# Patient Record
Sex: Female | Born: 1945 | Hispanic: Yes | Marital: Married
Health system: Midwestern US, Academic
[De-identification: ages and names within clinical notes are randomized; demographics above are authoritative.]

---

## 2020-03-07 ENCOUNTER — Encounter: Admit: 2020-03-07 | Discharge: 2020-03-07 | Payer: MEDICARE

## 2020-03-07 NOTE — Telephone Encounter
Called and spoke w/pt re:her appt 5/25 w/Dr. Janace Litten. Asked her if she's had any head imaging (MRI-head, and/or CT-head) in the past 5 years ? She states no.

## 2020-03-14 ENCOUNTER — Ambulatory Visit: Admit: 2020-03-14 | Discharge: 2020-03-14 | Payer: 59

## 2020-03-14 ENCOUNTER — Encounter: Admit: 2020-03-14 | Discharge: 2020-03-14 | Payer: 59

## 2020-03-14 DIAGNOSIS — G3184 Mild cognitive impairment, so stated: Secondary | ICD-10-CM

## 2020-03-14 DIAGNOSIS — I10 Essential (primary) hypertension: Secondary | ICD-10-CM

## 2020-03-14 DIAGNOSIS — R4189 Other symptoms and signs involving cognitive functions and awareness: Secondary | ICD-10-CM

## 2020-03-14 DIAGNOSIS — M1990 Unspecified osteoarthritis, unspecified site: Secondary | ICD-10-CM

## 2020-03-14 DIAGNOSIS — K589 Irritable bowel syndrome without diarrhea: Secondary | ICD-10-CM

## 2020-03-14 DIAGNOSIS — H269 Unspecified cataract: Secondary | ICD-10-CM

## 2020-03-14 DIAGNOSIS — R413 Other amnesia: Secondary | ICD-10-CM

## 2020-03-14 MED ORDER — SERTRALINE 25 MG PO TAB
25 mg | ORAL_TABLET | Freq: Every evening | ORAL | 5 refills | Status: AC
Start: 2020-03-14 — End: ?

## 2020-03-14 NOTE — Progress Notes
Referring Provider:    Scot Jun, MD  [address]  [place],  [place] [address]      Chief Complaint:  Cynthia Bell is a 74 y.o. female here for cognitive evaluation.  Additional history provided by: husband, Manual  Onset: about 6 months  Course: slow decline    _______________________________________________________________________  _____________________HISTORY__________________________________________  Cognitive History:  Has trouble remembering pertinent details of recent events; pt wonders if stress with her daughter is playing a role.  Is not repeating herself.  Has some trouble remembering important information of the distant past.    Functional History: Lives with her husband.  Independent with basic ADLs, without prompting.  Takes medications independently without frequently forgetting.  Cooks, cleans, does laundry, all mostly as well as before, although husband reports takes longer time to cook something.  Helps handle the finances, but husband reports is slower than before and has double paid some bills.  Stopped driving d/t R wrist fx, reportedly not d/t cognitive impairment.      Behavioral and Neuropsychiatric History: PHQ-2 Score: 2 (03/14/2020 10:36 AM)  Depressed mood, currently not completely controlled off medication per pt.  Denies SI.  No noticed personality or behavioral changes.    Other ROS  Hallucinations: denies    Delusions: no  Rest tremor: no    Gait Changes: no  Chronic hearing loss: no  Speech changes: no  Cognitive fluctuations: good and bad days  Incontinence: no  Sleep:  rarely snores, doesn't act out dreams.  Denies insomnia.  Wt loss: slowly   Seizures: no  All other ROS are negative    Caregiver Burden Expressed: does not rely on caregiving    Social Support:  pt has good support     Advanced Care Planning:  Discussed with pt.  Husband is POA and advance directive is in place.    Safety Assessment (falls, etc):  Rare falls    Social hx:  Denies heavy alcohol, tobacco and illicit substance use.    Family hx:  Father with memory trouble, onset after age 1.  ______________________________________________________________________  ____________________MEDICATIONS_____________________________________  ? alendronate (FOSAMAX) 70 mg tablet Take 70 mg by mouth every 7 days.   ? cholecalciferol (VITAMIN D-3) 1,000 units tablet Take 1,000 Units by mouth daily.   ? FOLIC ACID PO Take  by mouth.   ? gabapentin (NEURONTIN) 300 mg capsule Take 300 mg by mouth three times daily. 3 @ HS   ? lisinopril (PRINIVIL; ZESTRIL) 10 mg tablet Take 10 mg by mouth daily.   ? Methotrexate Sodium 2.5 mg DsPk Take 6 Tabs by mouth every 7 days.   ? sertraline (ZOLOFT) 25 mg tablet Take one tablet by mouth at bedtime daily.       High Risk Medication Review: Gabapentin    ______________________________________________________________________________  ___________________PHYSICAL AND COGNITIVE EXAM______________________  Vital Signs: BP (!) 145/56  - Pulse 79  - Ht 160 cm (63)  - Wt 59.7 kg (131 lb 9.6 oz)  - LMP  (LMP Unknown)  - BMI 23.31 kg/m?   Asymptomatic    Mental Status:    Alert.  NAD  Speech is fluent with intact comprehension      Cranial Nerves  EOMI bilat  No facial asymmetry  Shoulders equal   Tongue protrudes midline    Motor  Moves all extremities equally  No bradykinesia or rest tremor appreciated    Coordination  No ataxia on finger to nose testing    CV  No  LE edema    Gait  Nl posture, stride, arm swing and turns     NEUROBEHAVIORAL TESTING                               Labs / Imaging  No results found for: TSH  No results found for: VITB12    B12 level 575, TSH wnl, creat wnl (05/2019)    __________________________________________________________________  _______________DIAGNOSIS AND PLAN_______________________________    DIAGNOSIS:  74 y.o. female with h/o HTN, rheumatoid arthritis who presents with a gradual decline in cognition over at least the past 6 months. Functioning is not currently significantly affected.      Problem   Cognitive Impairment    Etiology likely multifactorial, related to mood diosrder, medication.  Cannot r/o underlying neurodegenerative dz such as AD.    02/2020:  Neuropsych eval ordered       Stage:   and CDR =   0.5      CARE PLAN  Further Evaluation  ? Neuropsych eval  ? MRI head    Cognitive therapy plan  ? None currently indicated       Neuropsychiatric therapy  ? Depressed mood, currently not completely controlled off medication per pt.  Risks and benefits of starting Zoloft 25mg  qHS discussed, including but not limited to may increase risk of bleed when used in combination with Methotrexate* and pt would like to.  Rx provided.  Educated that mood disorders, if uncontrolled, can worsen cognition.    ? Risks and benefits of Gabapentin discussed, including potential to worsen cognition.  Advised to weigh risks vs benefits carefully and that if desiring to stop, that it is important to taper to discontinuation and not to stop abruptly.    Lifestyle Recommendations  ? Encouraged to stay active mentally and physically  ? Encouraged socializing and eating a heart healthy diet  ? Discussed importance of 8 hours of sleep nightly    Referrals  ? Educational materials provided   ? Caution with driving; numbers provided for formal driving evaluation, if desired  ? Research: may discuss pending further eval  ? My Alliance: [email or web address]    RTC 51m    I spent a total of 70 minutes with the patient;  35 minutes were spent in counseling and coordination of care as noted above.     Medical decision making: moderate to severe

## 2020-04-19 ENCOUNTER — Encounter: Admit: 2020-04-19 | Discharge: 2020-04-19 | Payer: MEDICARE

## 2020-04-19 ENCOUNTER — Ambulatory Visit: Admit: 2020-04-19 | Discharge: 2020-04-19 | Payer: MEDICARE

## 2020-04-19 DIAGNOSIS — G3184 Mild cognitive impairment, so stated: Secondary | ICD-10-CM

## 2020-04-19 MED ORDER — GADOBENATE DIMEGLUMINE 529 MG/ML (0.1MMOL/0.2ML) IV SOLN
10 mL | Freq: Once | INTRAVENOUS | 0 refills | Status: CP
Start: 2020-04-19 — End: ?
  Administered 2020-04-19: 17:00:00 10 mL via INTRAVENOUS

## 2020-04-25 ENCOUNTER — Encounter: Admit: 2020-04-25 | Discharge: 2020-04-25 | Payer: MEDICARE

## 2020-04-25 NOTE — Telephone Encounter
-----   Message from Noah Charon, DO sent at 04/19/2020  4:37 PM CDT -----  MRI personally reviewed.  Please let patient and family know that MRI head shows mild generalized shrinking, which can be seen with aging.  No significant stroke was seen.  There were a few incidental small lesions seen in the front part of the skull, cause of which is uncertain, but will need f/u with PCP.  Amy, if you could please also notify PCP and fax report so it's not missed.  If new, may need eval with oncology.  Thank you.

## 2020-04-25 NOTE — Telephone Encounter
Patient advised and verbalized understanding of Dr. Emilie Rutter interpretation and recommendation about MRI as stated below.  Patient states she will follow up with Dr. Thedore Mins and had no further questions at this time.      MRI head report from 04/19/2020 faxed to Dr. Doristine Church office.   Called Dr. Doristine Church office at [phone] and left message for her nurse, advising abnormal MRI report has been faxed to Dr. Thedore Mins for follow up.

## 2020-06-29 ENCOUNTER — Encounter: Admit: 2020-06-29 | Discharge: 2020-06-29 | Payer: MEDICARE

## 2020-06-29 ENCOUNTER — Ambulatory Visit: Admit: 2020-06-29 | Discharge: 2020-06-29 | Payer: MEDICARE

## 2020-06-29 DIAGNOSIS — M1990 Unspecified osteoarthritis, unspecified site: Secondary | ICD-10-CM

## 2020-06-29 DIAGNOSIS — G3184 Mild cognitive impairment, so stated: Secondary | ICD-10-CM

## 2020-06-29 DIAGNOSIS — R4189 Other symptoms and signs involving cognitive functions and awareness: Secondary | ICD-10-CM

## 2020-06-29 DIAGNOSIS — K589 Irritable bowel syndrome without diarrhea: Secondary | ICD-10-CM

## 2020-06-29 DIAGNOSIS — R413 Other amnesia: Secondary | ICD-10-CM

## 2020-06-29 DIAGNOSIS — H269 Unspecified cataract: Secondary | ICD-10-CM

## 2020-06-29 DIAGNOSIS — I10 Essential (primary) hypertension: Secondary | ICD-10-CM

## 2020-06-29 NOTE — Progress Notes
Referring Provider:    Scot Jun, MD  [address]  [place],  [place] [address]      Chief Complaint:  Cynthia Bell is a 74 y.o. female here for cognitive evaluation.  Additional history provided by: husband, Manual  Onset: 6-12 months  Course: slow decline    _______________________________________________________________________  _____________________HISTORY__________________________________________  Interval Cognitive/Functional History:  Since she was last here, 02/2020, her cognition is a little worse.  Misplacing items more.  Once in a while will forget names of immediate family.  Remains independent with basic ADLs, without prompting.  Remains slow with making meals.  Husband does the laundry mainly, but she still does it too.  Still tries to help handle the finances, but misplaces the checks or checkbook.  Takes medications independently, but does forget (unclear how often).  Does not drive.      HPI, 02/2020  Has trouble remembering pertinent details of recent events; pt wonders if stress with her daughter is playing a role.  Is not repeating herself.  Has some trouble remembering important information of the distant past.    Lives with her husband.  Independent with basic ADLs, without prompting.  Takes medications independently without frequently forgetting.  Cooks, cleans, does laundry, all mostly as well as before, although husband reports takes longer time to cook something.  Helps handle the finances, but husband reports is slower than before and has double paid some bills.  Stopped driving d/t R wrist fx, reportedly not d/t cognitive impairment.      Behavioral and Neuropsychiatric History: PHQ-2 Score: 2 (06/29/2020 11:36 AM)  Depressed mood, currently not completely controlled; has been prescribed Zoloft, unclear if taking.  No SI.  No noticed personality or behavioral changes.    Other ROS  Hallucinations: denies    Delusions: no  Rest tremor: no    Gait Changes: no  Chronic hearing loss: no  Speech changes: no  Cognitive fluctuations: good and bad days  Incontinence: no  Sleep:  rarely snores, doesn't act out dreams.  Denies insomnia.   Seizures: no  All other ROS are negative    Caregiver Burden Expressed: no   Social Support:  pt has good support     Advanced Care Planning:   Husband is POA and advance directive is in place.    Safety Assessment (falls, etc):  Rare falls    Social hx:  Denies heavy alcohol, tobacco and illicit substance use.    Family hx:  Father with memory trouble, onset after age 5.  ______________________________________________________________________  ____________________MEDICATIONS_____________________________________  ? alendronate (FOSAMAX) 70 mg tablet Take 70 mg by mouth every 7 days.   ? cholecalciferol (VITAMIN D-3) 1,000 units tablet Take 1,000 Units by mouth daily.   ? FOLIC ACID PO Take  by mouth.   ? gabapentin (NEURONTIN) 300 mg capsule Take 300 mg by mouth three times daily. 3 @ HS   ? lisinopril (PRINIVIL; ZESTRIL) 10 mg tablet Take 10 mg by mouth daily.   ? Methotrexate Sodium 2.5 mg DsPk Take 6 Tabs by mouth every 7 days.   ? sertraline (ZOLOFT) 25 mg tablet Take one tablet by mouth at bedtime daily.   (pt is not sure if medication list is correct)    High Risk Medication Review: Gabapentin    ______________________________________________________________________________  ___________________PHYSICAL AND COGNITIVE EXAM______________________  Vital Signs: BP (!) 151/55  - Pulse 68  - Ht 157.5 cm (62)  - Wt 60.8 kg (134 lb)  - LMP  (  LMP Unknown)  - BMI 24.51 kg/m?   Asymptomatic    Mental Status:    Alert.  NAD  Speech is fluent with intact comprehension      Cranial Nerves  EOMI bilat  No facial asymmetry  Shoulders equal     Motor  Moves all extremities equally  No bradykinesia or rest tremor appreciated    CV  No LE edema    Gait  Nl posture, stride, arm swing and turns     NEUROBEHAVIORAL TESTING                               Labs / Imaging  No results found for: TSH  No results found for: VITB12    B12 level 575, TSH wnl, creat wnl (05/2019)    MRI head w/wo (03/2020)  1. ?Age-appropriate mild cerebral volume loss without pattern specific   atrophy.   2. ?Mild nonspecific white matter disease, likely due to chronic   microvessel ischemic changes.   3. ?Small ill-defined enhancing T2 and DWI hyperintense bifrontal   calvarial lesions, larger on the left and indeterminate between a benign   etiology (such as asymmetric marrow or small vascular lesions) or   malignant etiology such as calvarial metastases or myelomatous lesions.     __________________________________________________________________  _______________DIAGNOSIS AND PLAN_______________________________    DIAGNOSIS:  74 y.o. female with h/o HTN, rheumatoid arthritis who presents with a gradual decline in cognition over at least the past 6-12 months. Functioning is reportedly not currently significantly affected.      Problem   Mild Neurocognitive Disorder    Vs early dementia (more consistent with objective testing).  Etiology likely multifactorial, related to mood diosrder, medication.  Cannot r/o underlying neurodegenerative dz such as AD.    02/2020:  Neuropsych eval ordered    06/2020:  STMS 21/38       Stage:   and CDR = 0.5-1      CARE PLAN  Further Evaluation  ? Neuropsych eval previously ordered - encouraged scheduling    Cognitive therapy plan  ? Risks and benefits of Aricept discussed - will hold pending further eval     Neuropsychiatric therapy  ? Depressed mood, currently not completely controlled; has been prescribed Zoloft, unclear if taking.  Advised to check and let us know.  Educated that mood disorders, if uncontrolled, can worsen cognition.    ? Risks and benefits of Gabapentin discussed, including potential to worsen cognition.  Advised to weigh risks vs benefits carefully and that if desiring to stop, that it is important to taper to discontinuation and not to stop abruptly.    Lifestyle Recommendations  ? Encouraged to stay active mentally and physically  ? Encouraged socializing and eating a heart healthy diet  ? Discussed importance of 8 hours of sleep nightly  ? Advised someone help with her medications     Referrals  ? Educational materials provided   ? Cautioned with driving; numbers provided for formal driving evaluation, if desired  ? Research: may discuss pending further eval  ? Rediscussed importance of MRI f/u with PCP regarding calvarial lesions.  If not already known, discussed importance of referral to oncology for further evaluation.  Previously requested report be faxed and PCP be notified and this has been re-requested to nursing staff as family is not sure or not if she is aware (note also sent electronically)  ? My Alliance: [email or web address]  RTC 2m, or sooner if needed    All questions answered.  Advised to call if any new symptoms, concerns or questions.  Pt and family voiced understanding.        I spent a total of 40 minutes with the patient; 25 minutes were spent in counseling and coordination of care as noted above.     Medical decision making: moderate to severe

## 2020-06-30 ENCOUNTER — Encounter: Admit: 2020-06-30 | Discharge: 2020-06-30 | Payer: MEDICARE

## 2020-06-30 NOTE — Telephone Encounter
-----   Message from Noah Charon, DO sent at 06/29/2020  2:34 PM CDT -----  Regarding: PCP call  I had previously requested MRI head w/wo (03/2020) report and images if able be sent to PCP to make sure #3 below was not missed.  I have rediscussed with the pt and husband and they are going to see if can get sooner appt with PCP (scheduled for later this month as of now) to discuss further.  If the calvarial lesions are new, should be referred to oncology for further evaluation. Thank you!       1. ?Age-appropriate mild cerebral volume loss without pattern specific   atrophy.   2. ?Mild nonspecific white matter disease, likely due to chronic   microvessel ischemic changes.   3. ?Small ill-defined enhancing T2 and DWI hyperintense bifrontal   calvarial lesions, larger on the left and indeterminate between a benign   etiology (such as asymmetric marrow or small vascular lesions) or   malignant etiology such as calvarial metastases or myelomatous lesions.

## 2020-06-30 NOTE — Telephone Encounter
LVM for triage nurse with direct line to call back if Dr. Thedore Mins has not received the radiologist report faxed to her on April 25, 2020, or needs access to images on the Cloud that [employer] share.  I said MRI is abnormal and patient needs a sooner appointment and if calvarial lesion is new may need referral to oncology, call with questions or if Dr. Thedore Mins cannot see MRI image and report.

## 2021-05-01 IMAGING — MG MAMMO SCRN BIL W/CAD TOMO
8 series · 8 of 24 positions shown · non-contrast
Comparison: The present examination has been compared to prior imaging studies.

Images Obtained from [facility]
INDICATION: Screening.
TECHNIQUE: Bilateral 2-D digital screening mammogram was performed followed by 3-D tomosynthesis.  Current study was also evaluated with a computer aided detection (CAD) system.
MAMMOGRAM FINDINGS:
There are scattered areas of fibroglandular density.
There are stable benign-type calcifications in the left breast.
No suspicious abnormality is seen in either breast.

[L MLO]
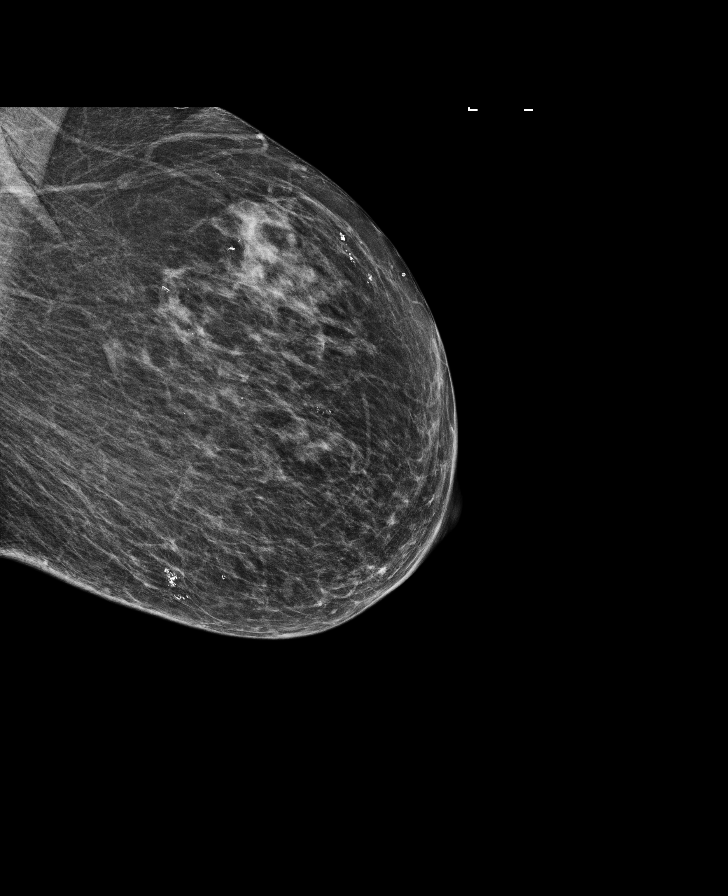

[R CC]
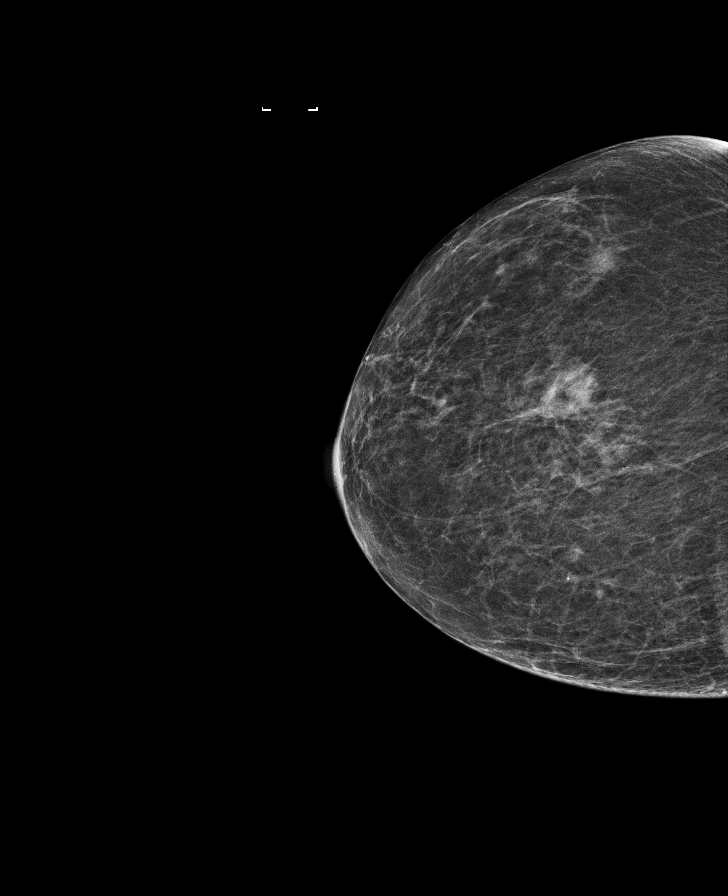

[R MLO]
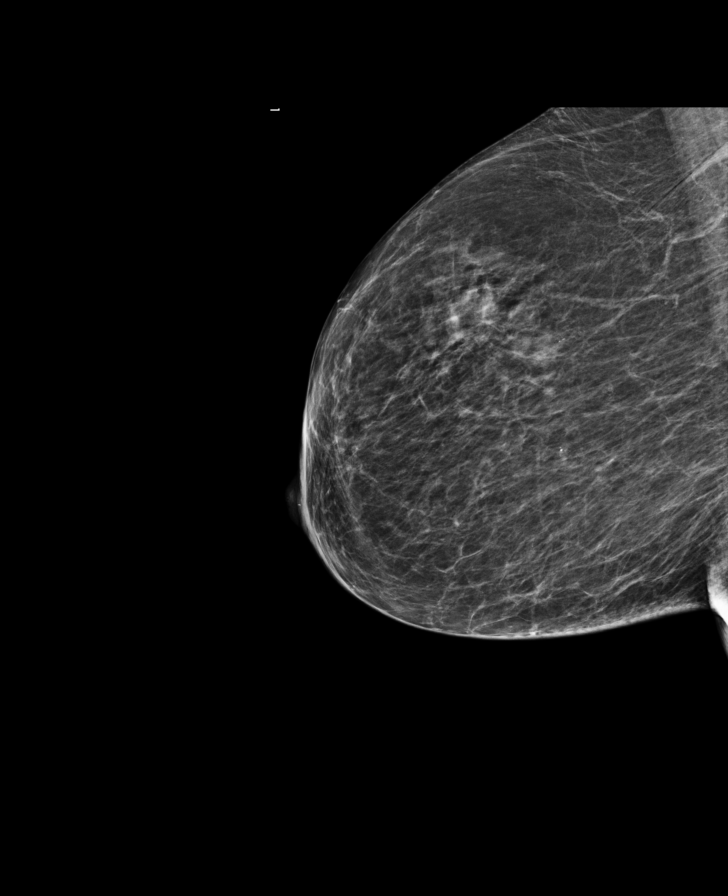

[L CC]
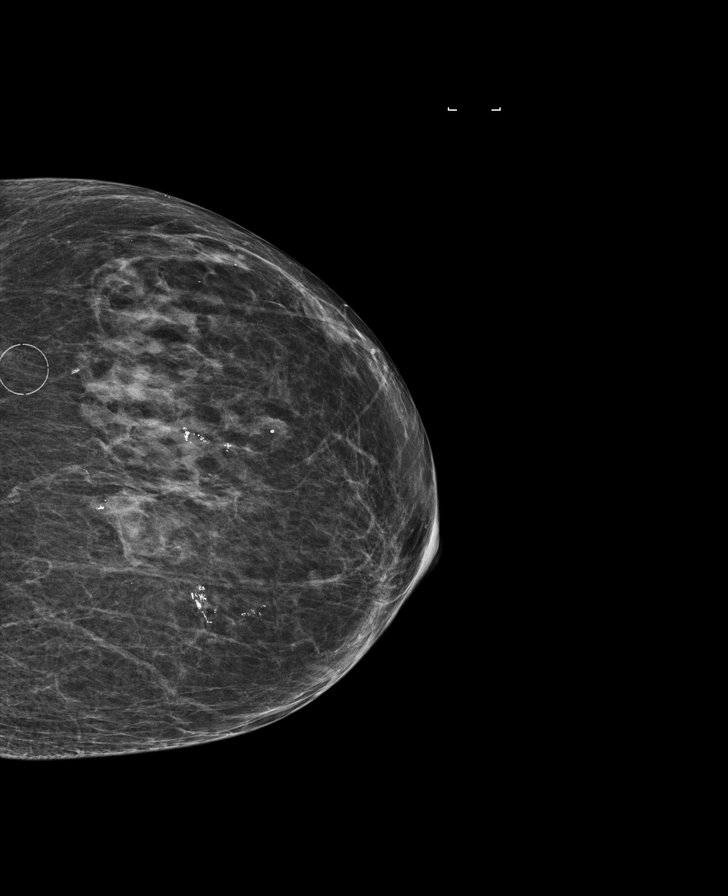

[L MLO tomo · tomo slice 22/43.0]
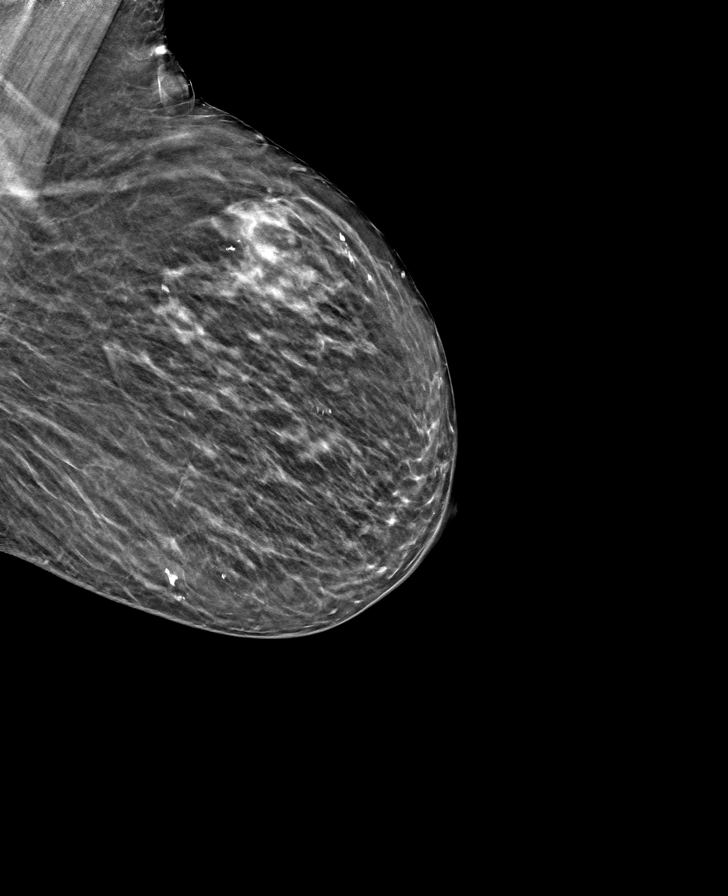

[R CC tomo · tomo slice 20/39.0]
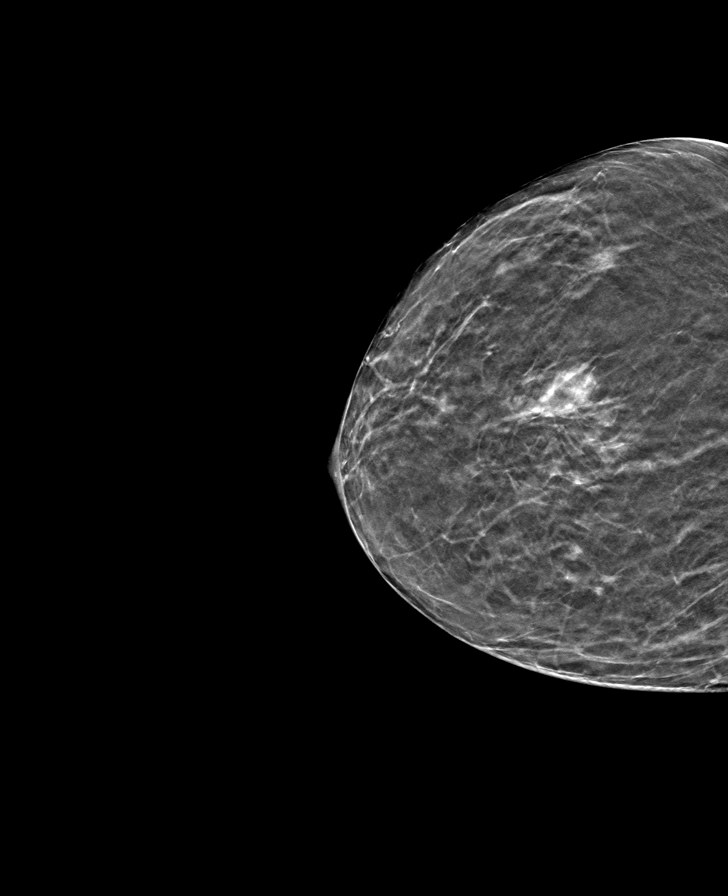

[L CC tomo · tomo slice 19/38.0]
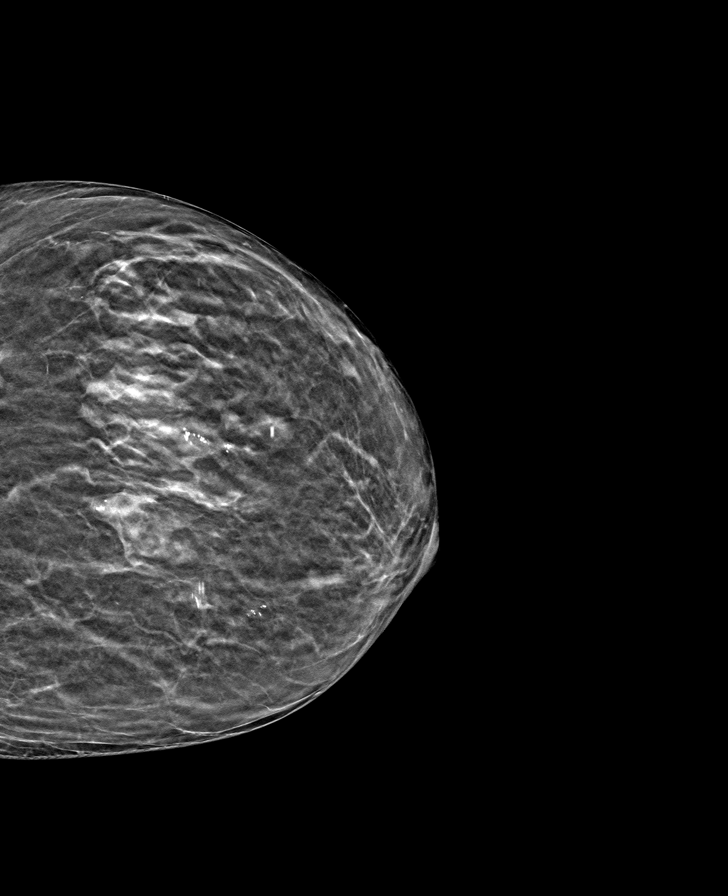

[R MLO tomo · tomo slice 21/42.0]
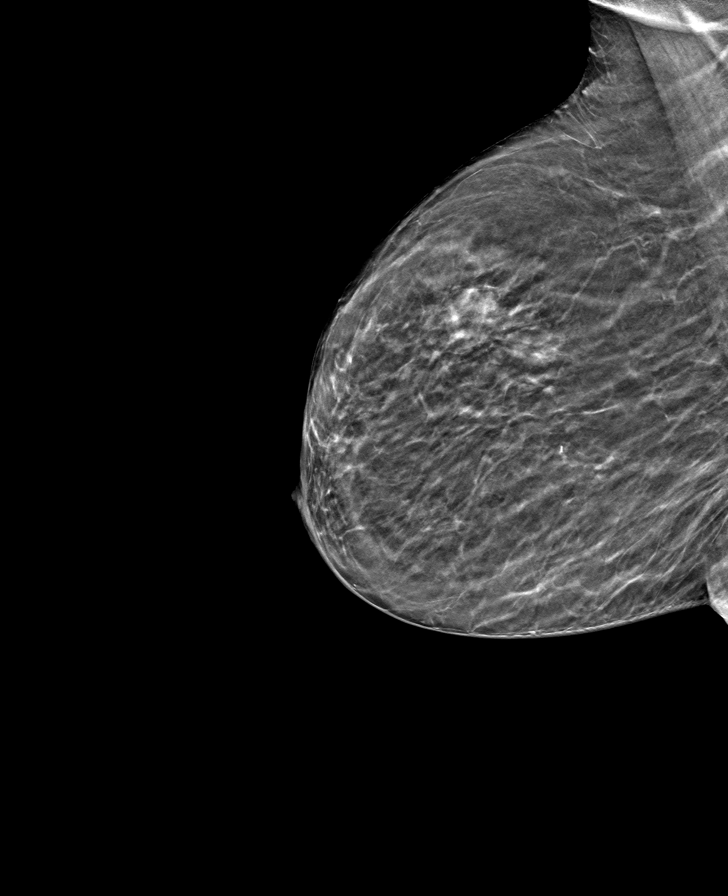

[8 of 24 positions shown; findings below may reference images not displayed]

IMPRESSION: There is no mammographic evidence of malignancy.
Screening mammogram recommended in 1 year.
BI-RADS Category 2: Benign

## 2021-05-01 IMAGING — MR MRI BRAIN W/WO CONTRAST
11 of 12 series · 47 of 48 positions shown · IV contrast (prohance)
Comparison: None.

HISTORY: 75-years-old female with abnormal findings, site specified.
TECHNIQUE: Multiplanar, multisequence MRI images of the brain are obtained prior to and following intravenous contrast.

CONTRAST: 15 mL of ProHance

[Series 5: t1_mpr_axial_pre · axial · 1.0mm · 0.90mm/px · z∈[-59,+97]mm · 7 of 160 slices shown]
[im 1/160]
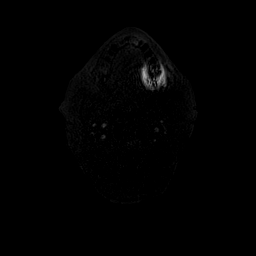
[im 27/160]
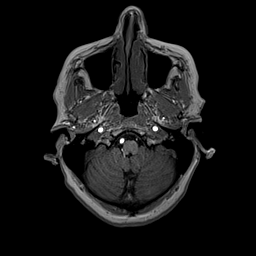
[im 54/160]
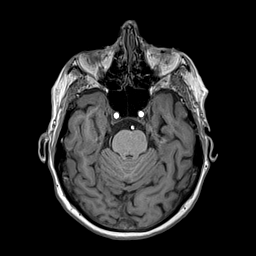
[im 80/160]
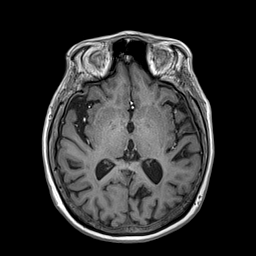
[im 107/160]
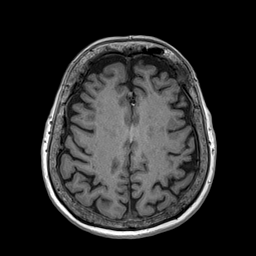
[im 133/160]
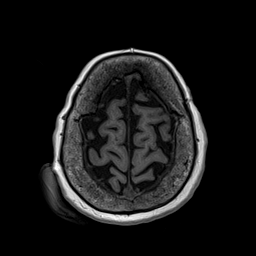
[im 160/160]
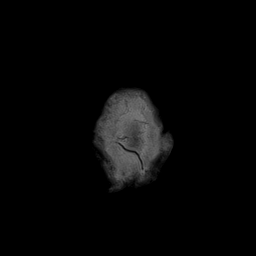

[Series 6: t1_mpr_axial_pre_mpr_sag · sagittal · 1.0mm · 0.90mm/px · 6 of 150 slices shown]
[im 1/150]
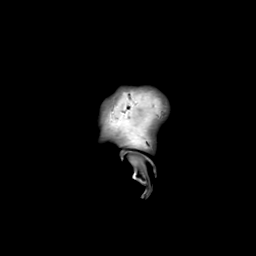
[im 30/150]
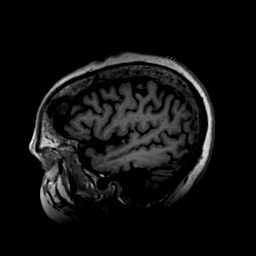
[im 60/150]
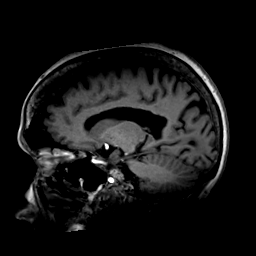
[im 90/150]
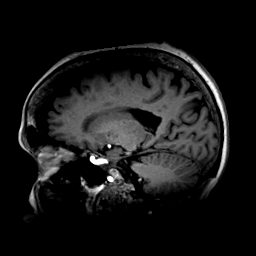
[im 120/150]
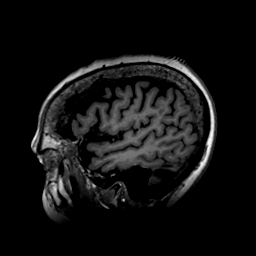
[im 150/150]
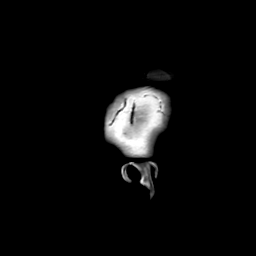

[Series 7: t1_mpr_axial_pre_mpr_cor · coronal · 1.0mm · 0.90mm/px · 8 of 200 slices shown]
[im 1/200]
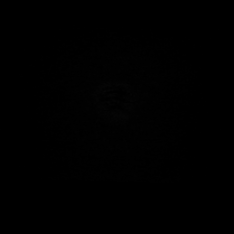
[im 29/200]
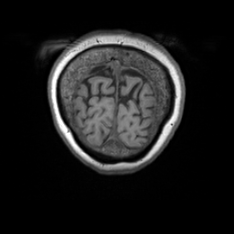
[im 57/200]
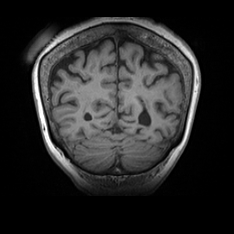
[im 86/200]
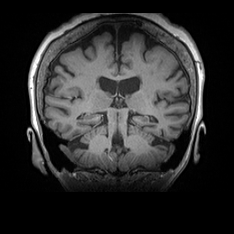
[im 114/200]
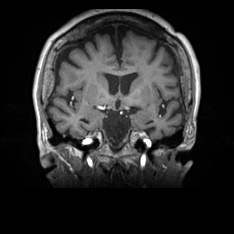
[im 143/200]
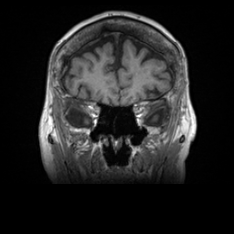
[im 171/200]
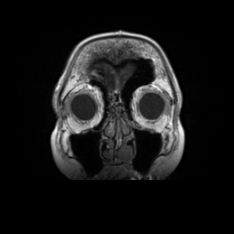
[im 200/200]
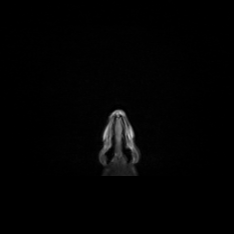

[Series 8: flair_axial_fs · axial · 4.0mm · 0.75mm/px · 1 of 30 slices shown]
[im 1/30]
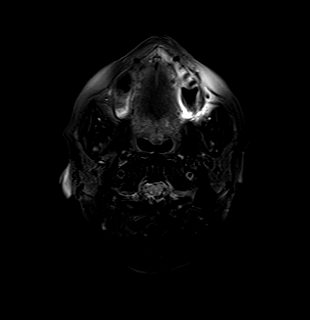

[Series 9: t2_axial · axial · 4.0mm · 0.38mm/px · 1 of 30 slices shown]
[im 1/30]
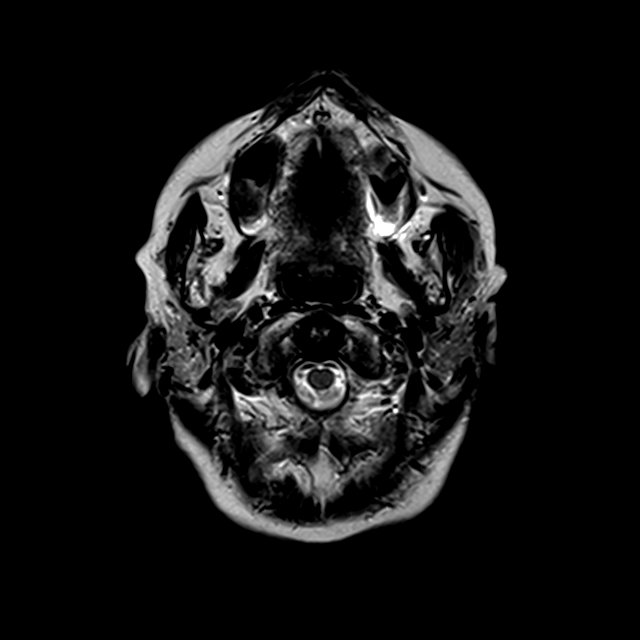

[Series 10: DWI · axial · 4.0mm · 0.86mm/px · 1 of 30 slices shown (1 of 2)]
[im 1/30]
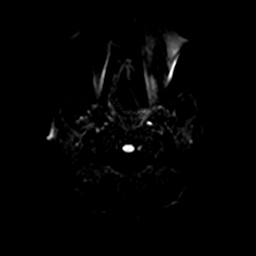

[Series 11: DWI · axial · 4.0mm · 0.86mm/px · 1 of 30 slices shown (2 of 2)]
[im 1/30]
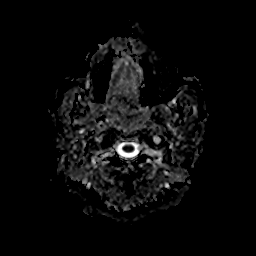

[Series 12: flash_axial · axial · 4.0mm · 0.75mm/px · 1 of 30 slices shown]
[im 1/30]
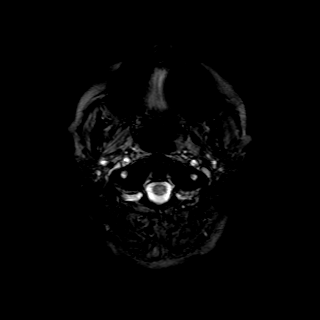

[Series 13: t1_mpr_axial+c · axial · 1.0mm · 0.90mm/px · z∈[-59,+97]mm · 7 of 160 slices shown]
[im 1/160]
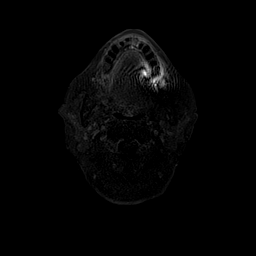
[im 27/160]
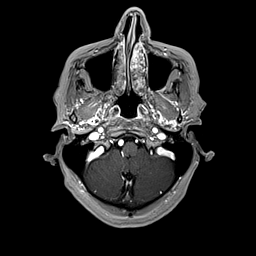
[im 54/160]
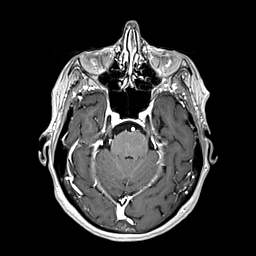
[im 80/160]
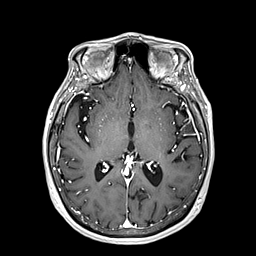
[im 107/160]
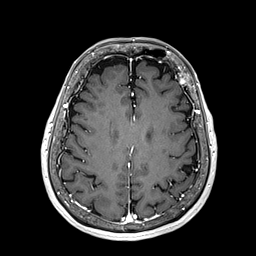
[im 133/160]
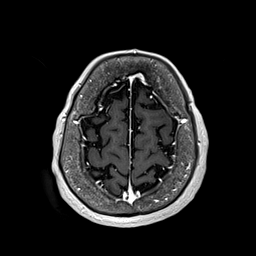
[im 160/160]
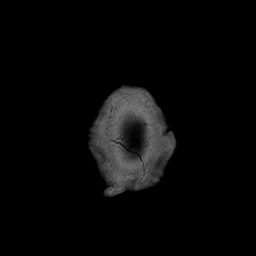

[Series 14: t1_mpr_axial+c_mpr_sag · sagittal · 1.0mm · 0.90mm/px · 6 of 150 slices shown]
[im 1/150]
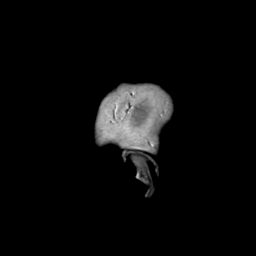
[im 30/150]
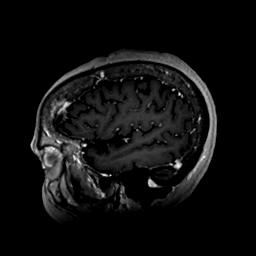
[im 60/150]
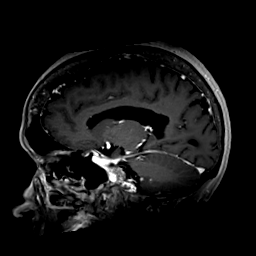
[im 90/150]
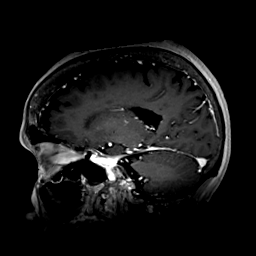
[im 120/150]
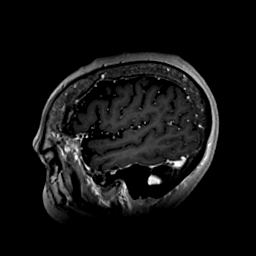
[im 150/150]
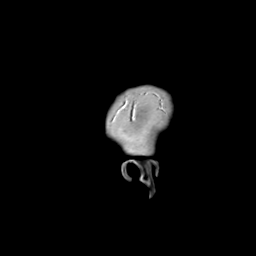

[Series 15: t1_mpr_axial+c_mpr_cor · coronal · 1.0mm · 0.90mm/px · 8 of 200 slices shown]
[im 1/200]
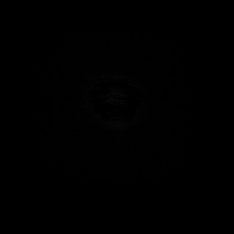
[im 29/200]
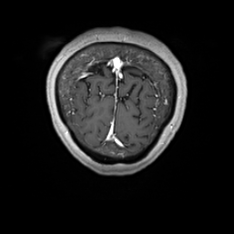
[im 57/200]
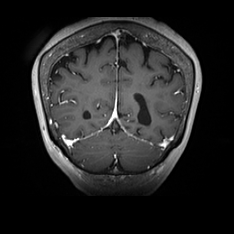
[im 86/200]
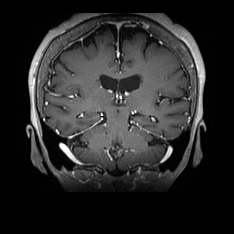
[im 114/200]
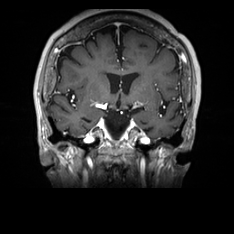
[im 143/200]
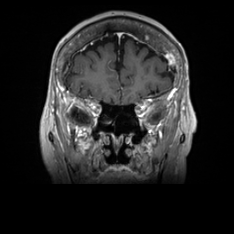
[im 171/200]
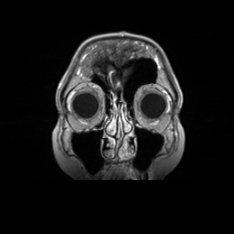
[im 200/200]
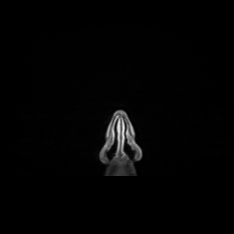

[47 of 48 positions shown; findings below may reference images not displayed]

FINDINGS: BRAIN PARENCHYMA: No acute infarct. Supratentorial white matter T2/FLAIR hyperintensities are nonspecific but statistically most likely to be related to chronic microangiopathic/senescent changes. Mild global brain atrophy. Single focus of susceptibility artifact in the left frontal lobe is nonspecific. No associated enhancement. No abnormal intracranial enhancement identified.

PITUITARY: Unremarkable.

VASCULATURE: Expected major intracranial flow voids are present.

VENTRICULAR SYSTEM: No hydrocephalus or midline shift.

EXTRA-AXIAL SPACES: No intra- or extra-axial fluid collections.

ORBITS: Unremarkable.

PARANASAL SINUSES AND MASTOID AIR CELLS: Predominantly clear.

BONES: There is a 21 x 11 x 15 mm enhancing lesion involving the left frontal skull.
IMPRESSION: 1. 21 x 11 x 15 mm enhancing lesion involving the left frontal skull is nonspecific. Consider obtaining a CT head to evaluate osseous matrix. Differential considerations include atypical intraosseous hemangioma or malignancy.

2. No acute intracranial abnormality.

## 2021-05-16 IMAGING — CT CT BRAIN WO/W CONTRAST
3 of 6 series · 13 of 47 positions shown, 15 images · IV contrast (isovue)
Comparison: None

HISTORY: 75 year old Female with abnormal finding on MRI .
TECHNIQUE: CT of the head was obtained without and with contrast. Dose reduction technique used:  Automated exposure control and adjustment of the mA and/or kV according to patient size. CT count previous 12 months: 0

CONTRAST: IV contrast:  100 mL Isovue 300. Serum creatinine: 0.6 mg/dL.

[Series 4: head w/o soft tissue · axial · non-contrast · 0.32mm/px · z∈[-176,-44]mm · 8 of 52 slices shown, 10 images]
[im 4/52  brain]
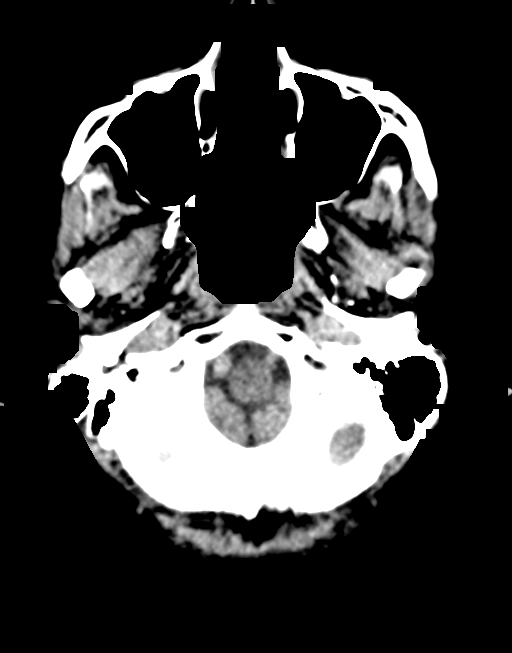
[im 4/52  bone]
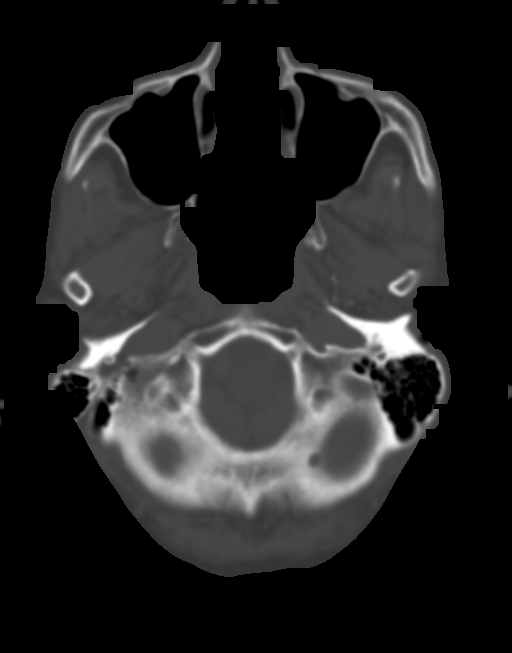
[im 11/52  brain]
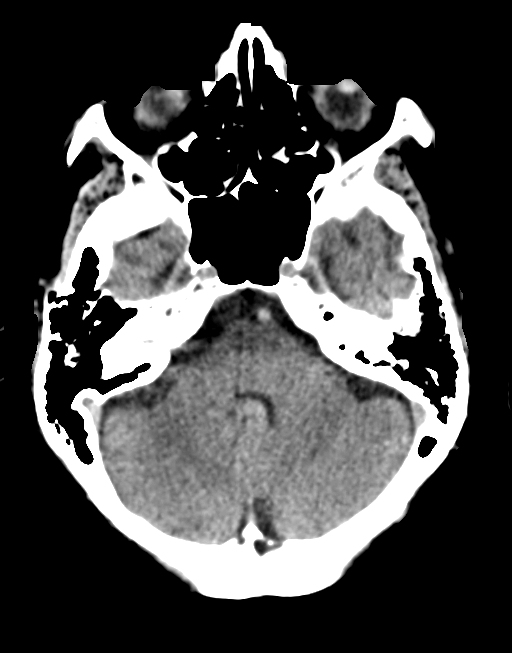
[im 19/52  brain]
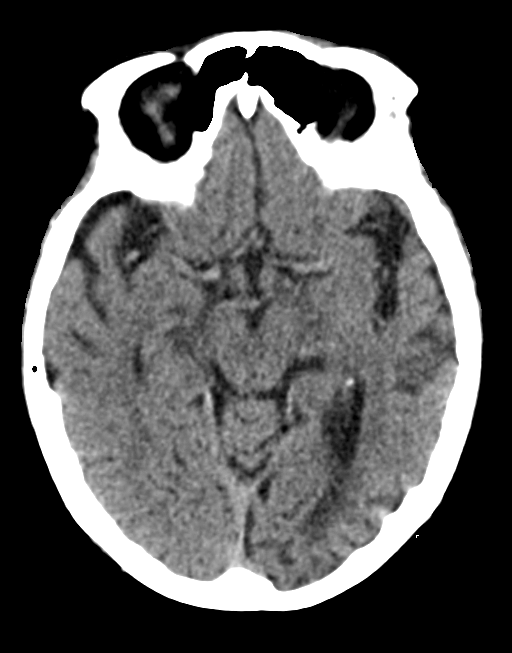
[im 22/52  brain]
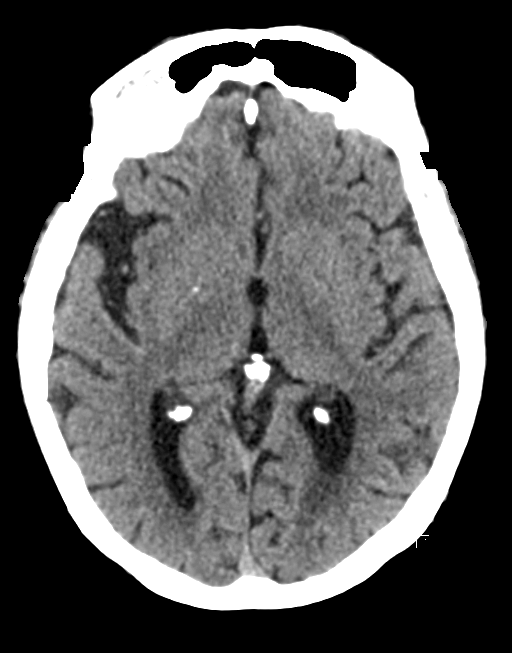
[im 30/52  brain]
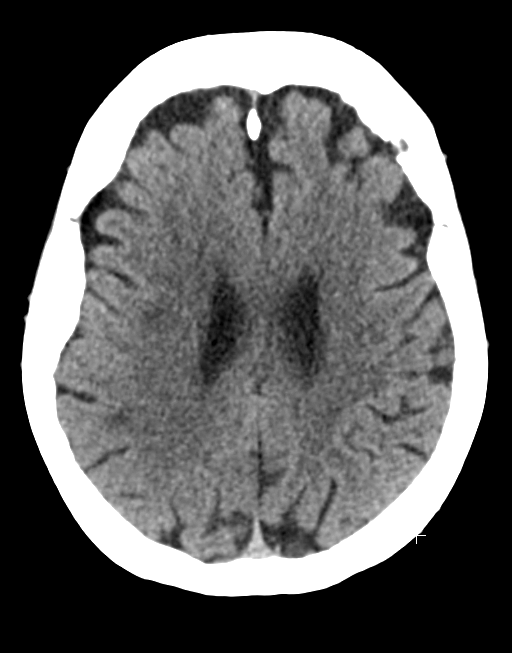
[im 30/52  bone]
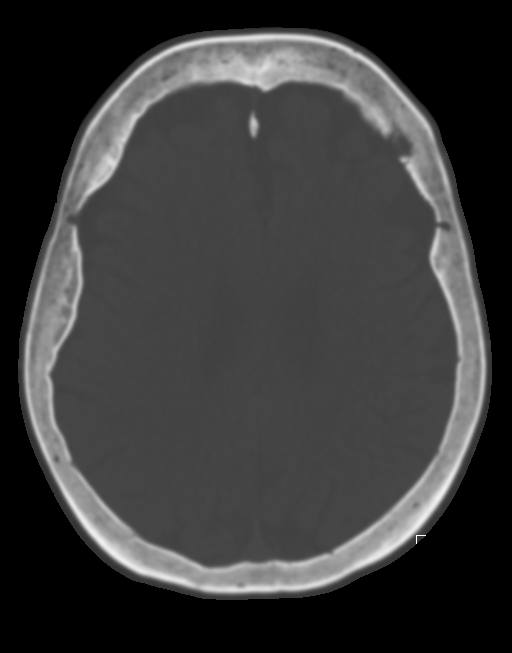
[im 33/52  brain]
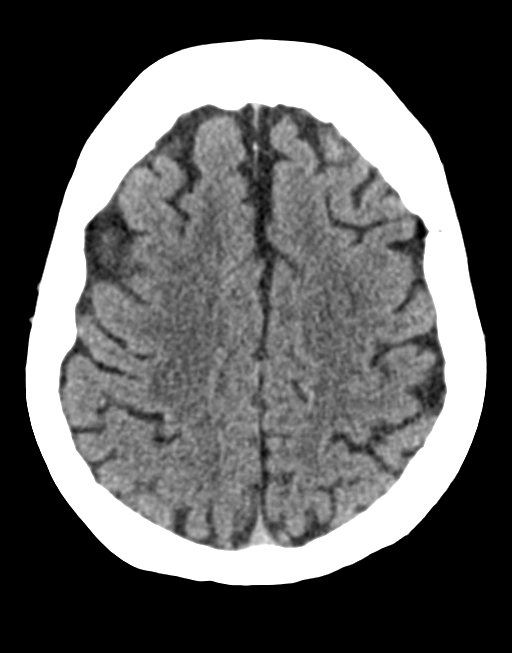
[im 41/52  brain]
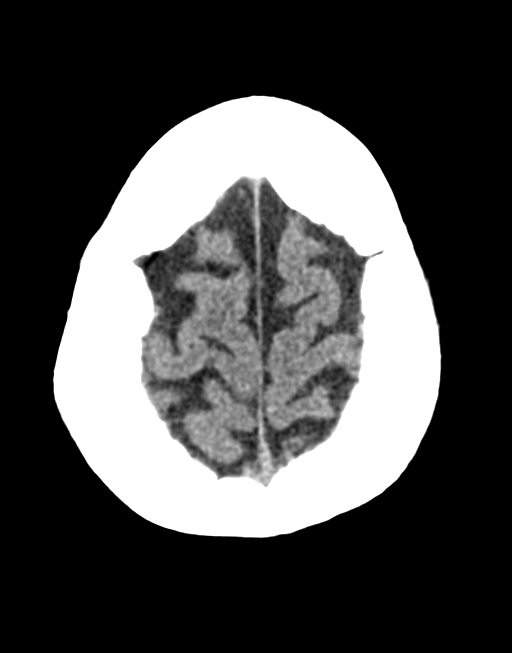
[im 48/52  brain]
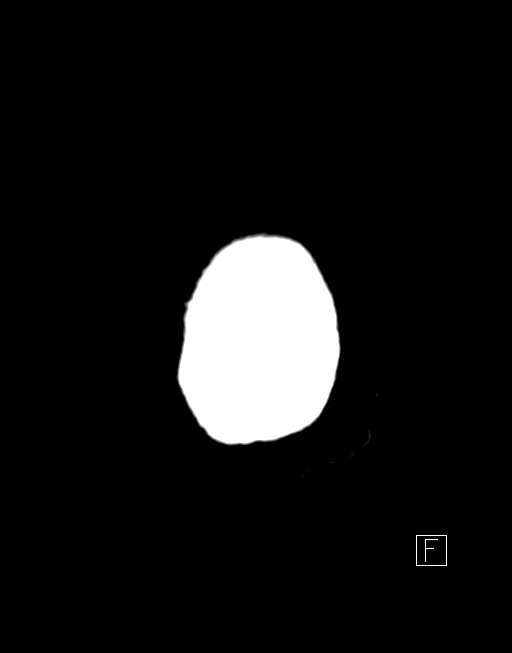

[Series 5: coronal pre · coronal · non-contrast · 0.31mm/px · 3 of 71 slices shown]
[im 18/71  brain]
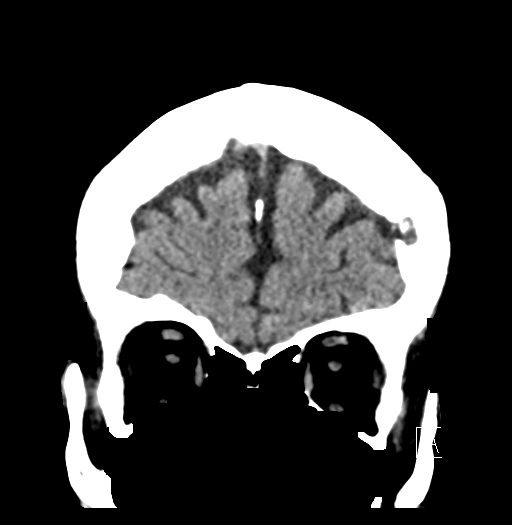
[im 36/71  brain]
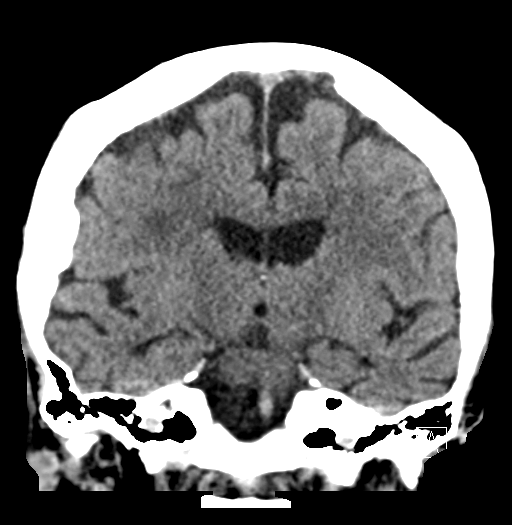
[im 53/71  brain]
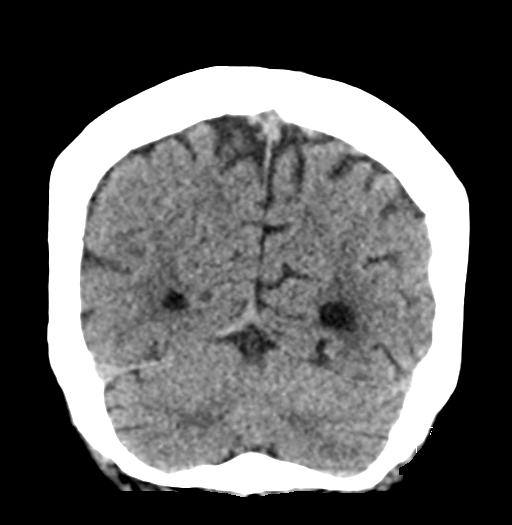

[Series 11: sagittal post · sagittal · 0.32mm/px · 2 of 57 slices shown]
[im 19/57  brain]
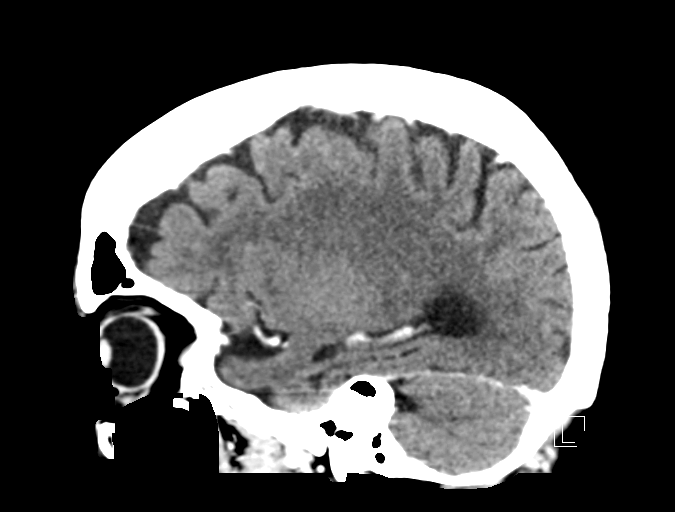
[im 38/57  brain]
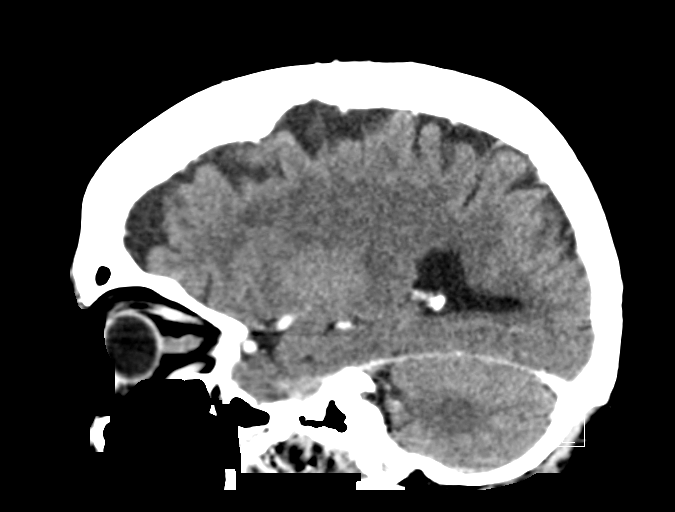

[13 of 47 positions shown; findings below may reference images not displayed]

FINDINGS: BRAIN PARENCHYMA: Supratentorial white matter area of hypoattenuation is nonspecific but statistically most likely to be related to chronic microangiopathic changes. Moderate global brain atrophy.No abnormal intracranial enhancement 

HEMORRHAGE: No acute intracranial hemorrhagic products.

EXTRA-AXIAL SPACES: No intra- or extra-axial fluid collections. Basilar cisterns are maintained.

VENTRICULAR SYSTEM: No hydrocephalus, midline shift or herniation.

BONES: The calvarium is intact.  Visualized paranasal sinuses and mastoid air cells are clear.
IMPRESSION: No acute intracranial abnormality.

Total radiation dose to patient is CTDIvol 116.94 mGy and DLP 1883.64 mGy-cm.

## 2021-06-07 IMAGING — CR KNEE LT 4 VWS MIN
1 series · 4 of 4 positions shown · non-contrast
Comparison: None.

HISTORY: Polyarthritis.
TECHNIQUE: Left knee 4 views

[Series 1: ap · 0.17mm/px · 4 of 4 slices shown]
[im 1/4]
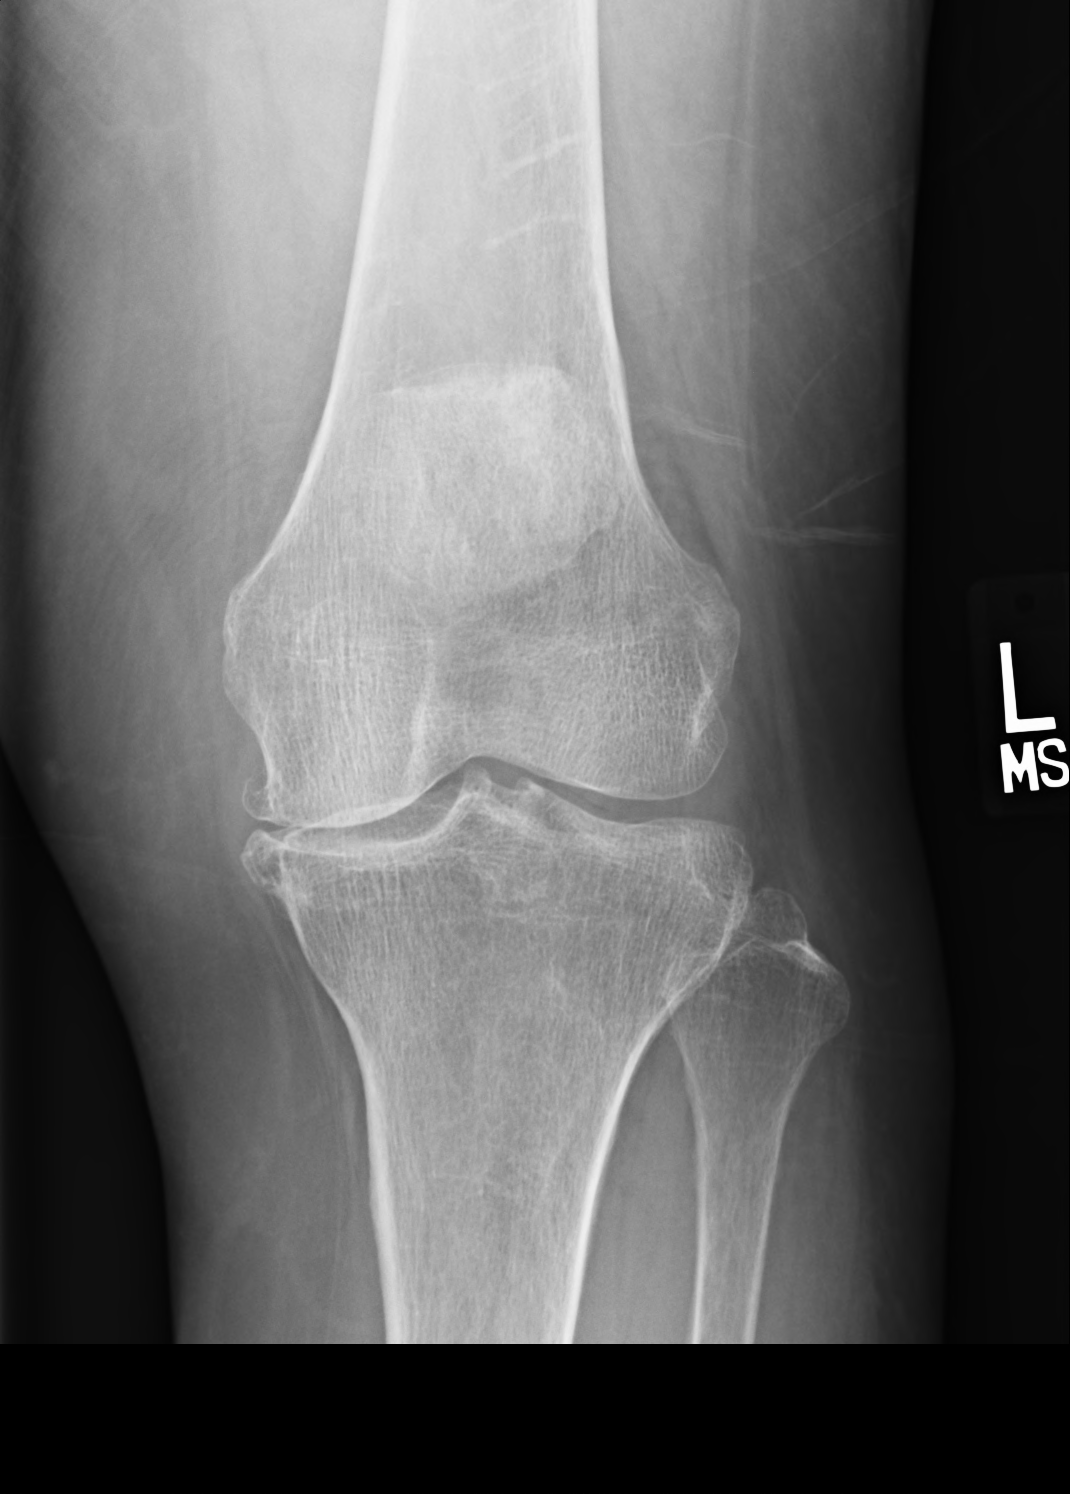
[im 2/4]
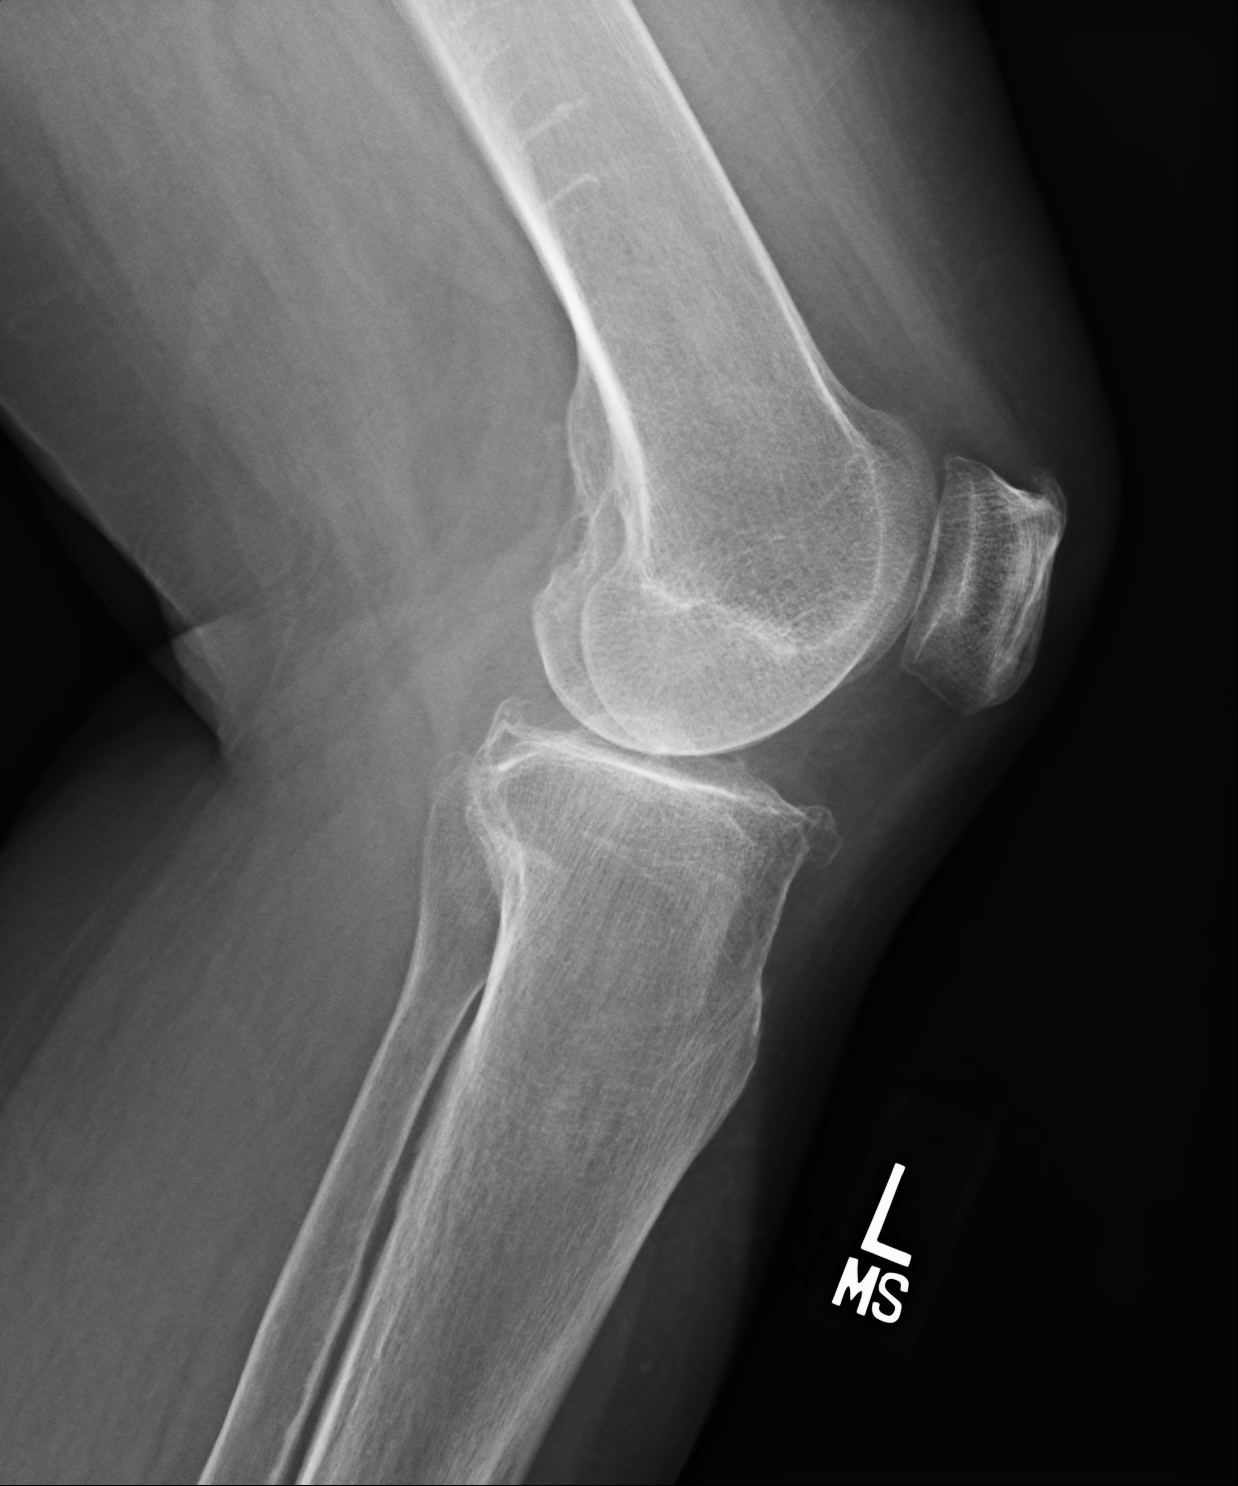
[im 3/4]
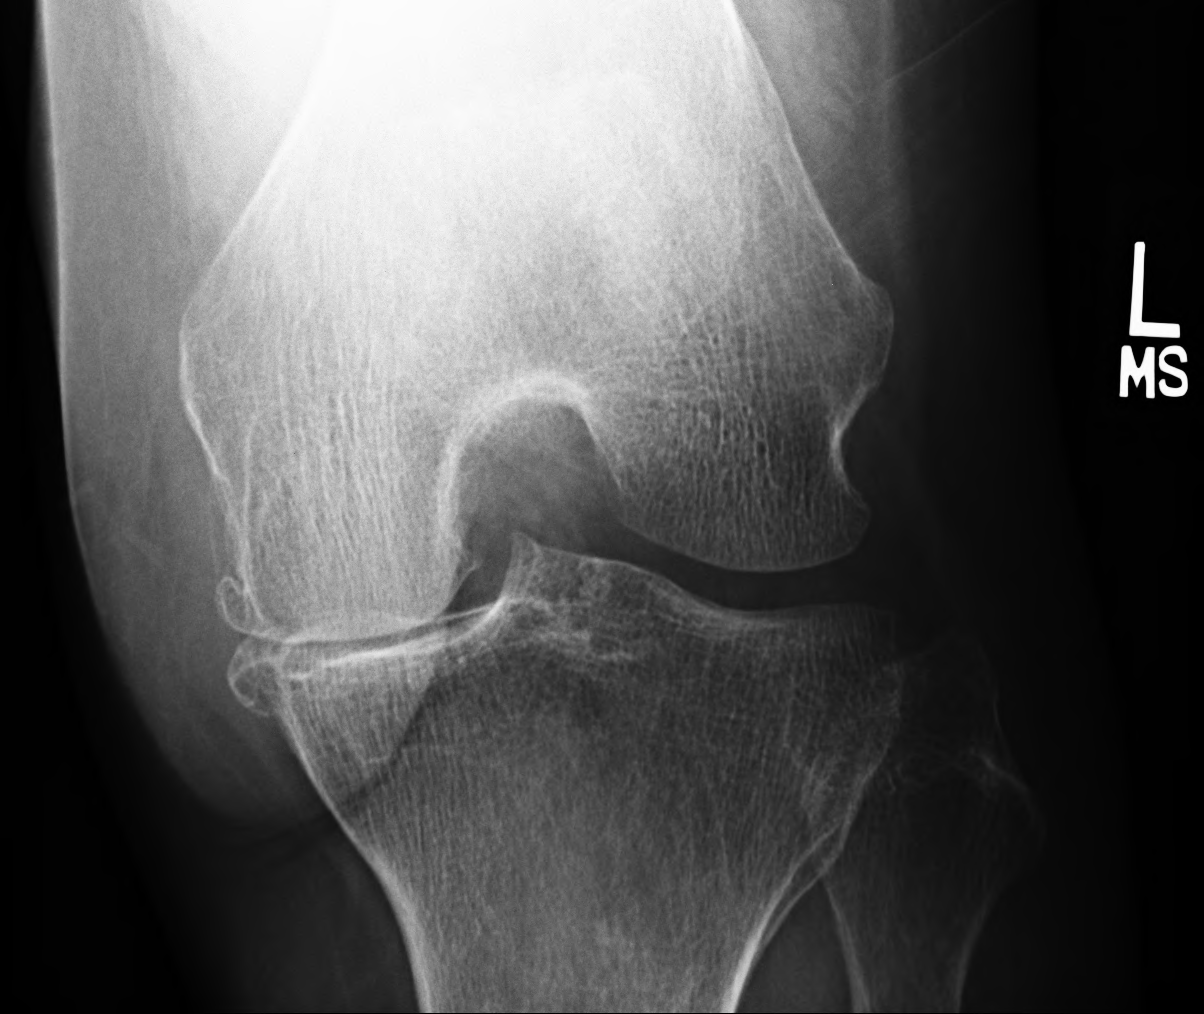
[im 4/4]
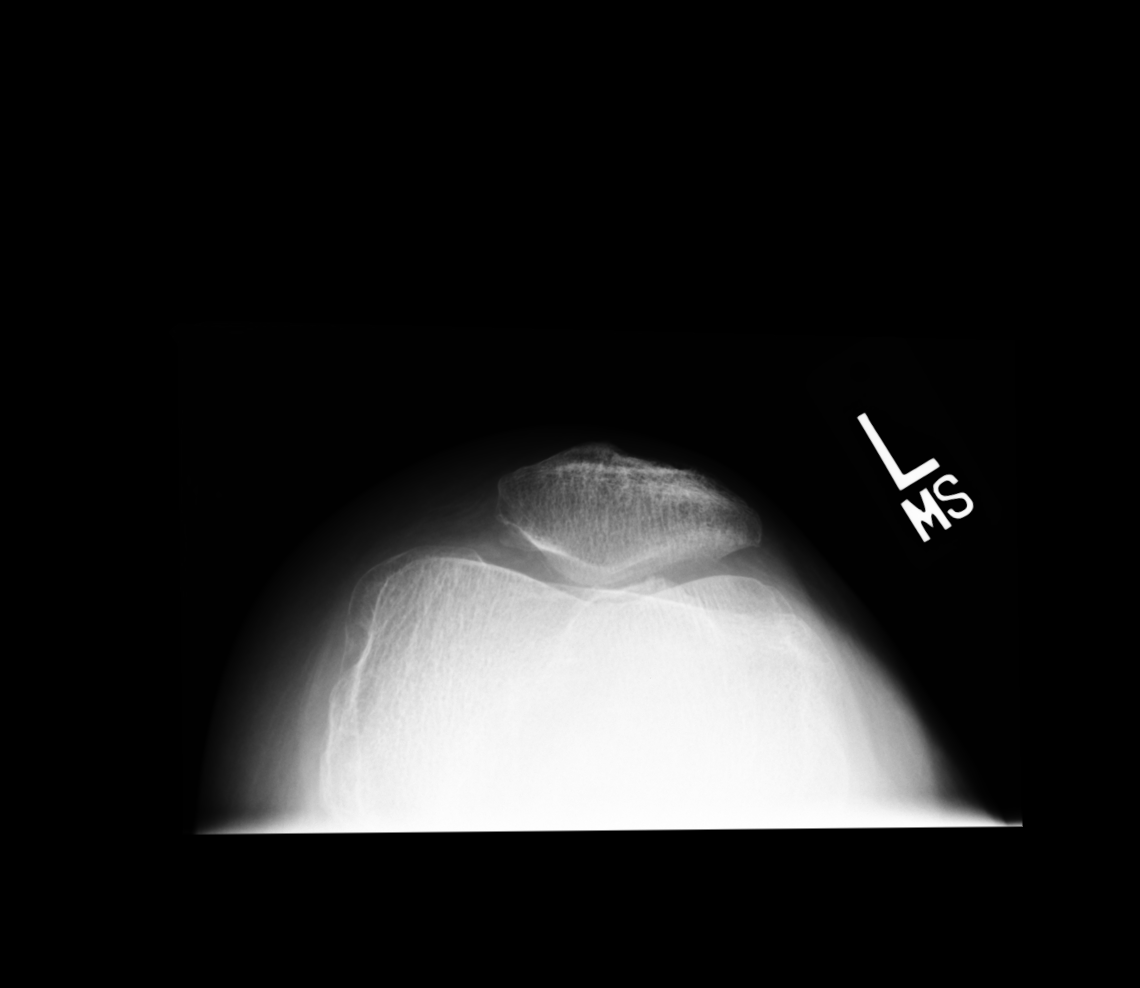

[4 of 4 positions shown; findings below may reference images not displayed]

FINDINGS: 4 view left knee demonstrates generalized demineralization. No fractures. No destructive lesions. Advanced medial compartment DJD with associated marginal osteophytes. Small effusion. Insertional enthesophyte superior margin of the patella. No loose body. Remaining soft tissues unremarkable.
IMPRESSION: Demineralization without fracture. Advanced medial compartment DJD and associated small effusion.

## 2021-06-07 IMAGING — CR KNEE RT 4 VWS MIN
1 series · 4 of 4 positions shown · non-contrast
Comparison: None.

Right knee radiographs, 4 views
INDICATION: Polyarthritis.

[Series 1: ap · 0.17mm/px · 4 of 4 slices shown]
[im 1/4]
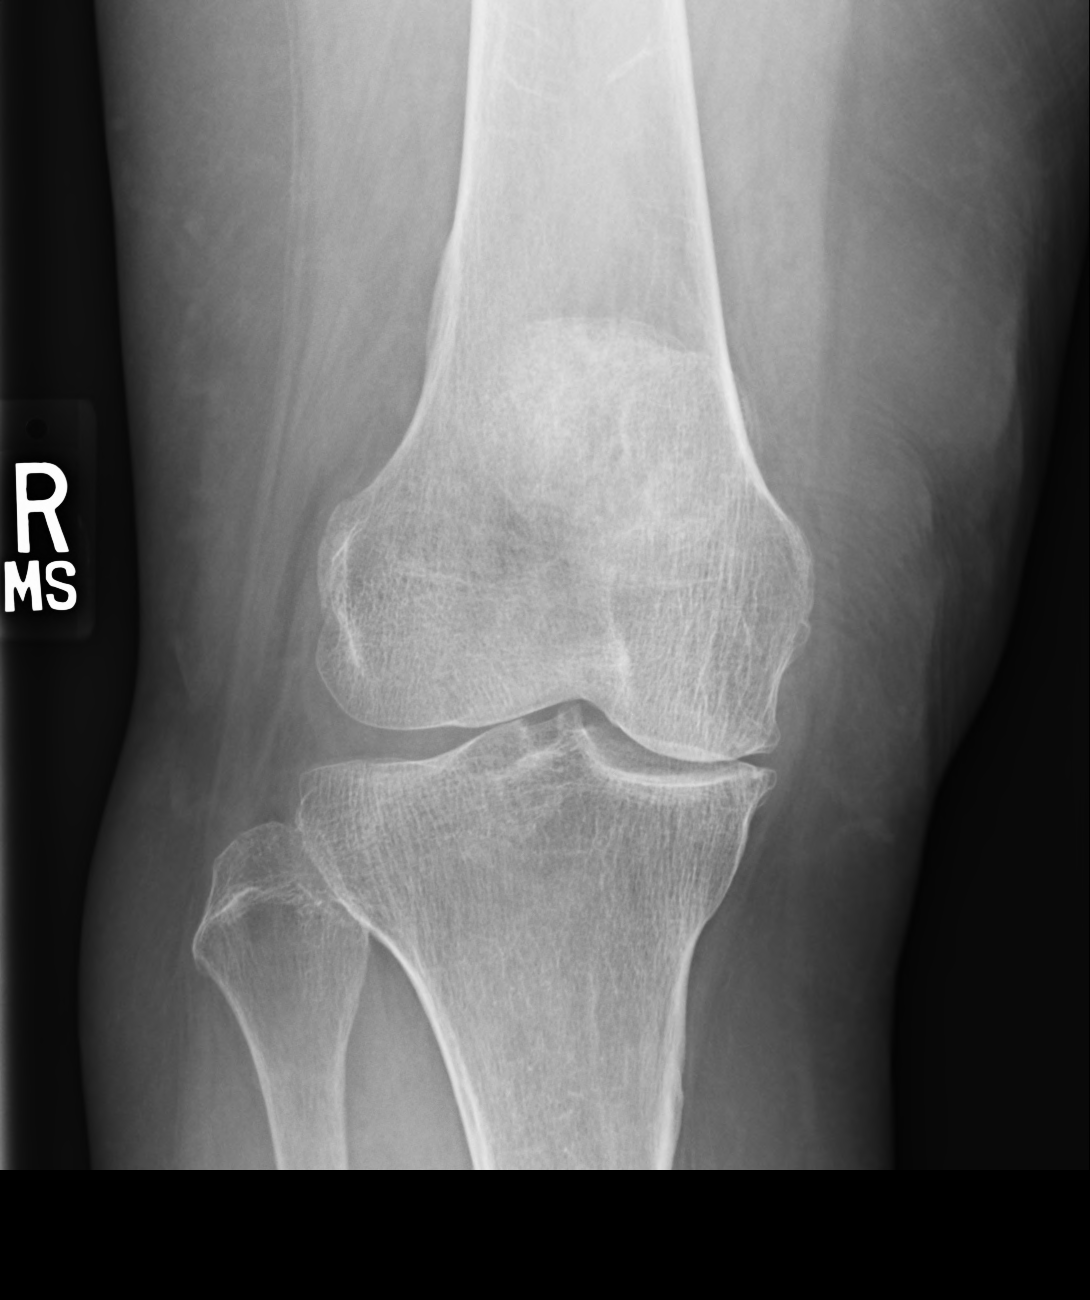
[im 2/4]
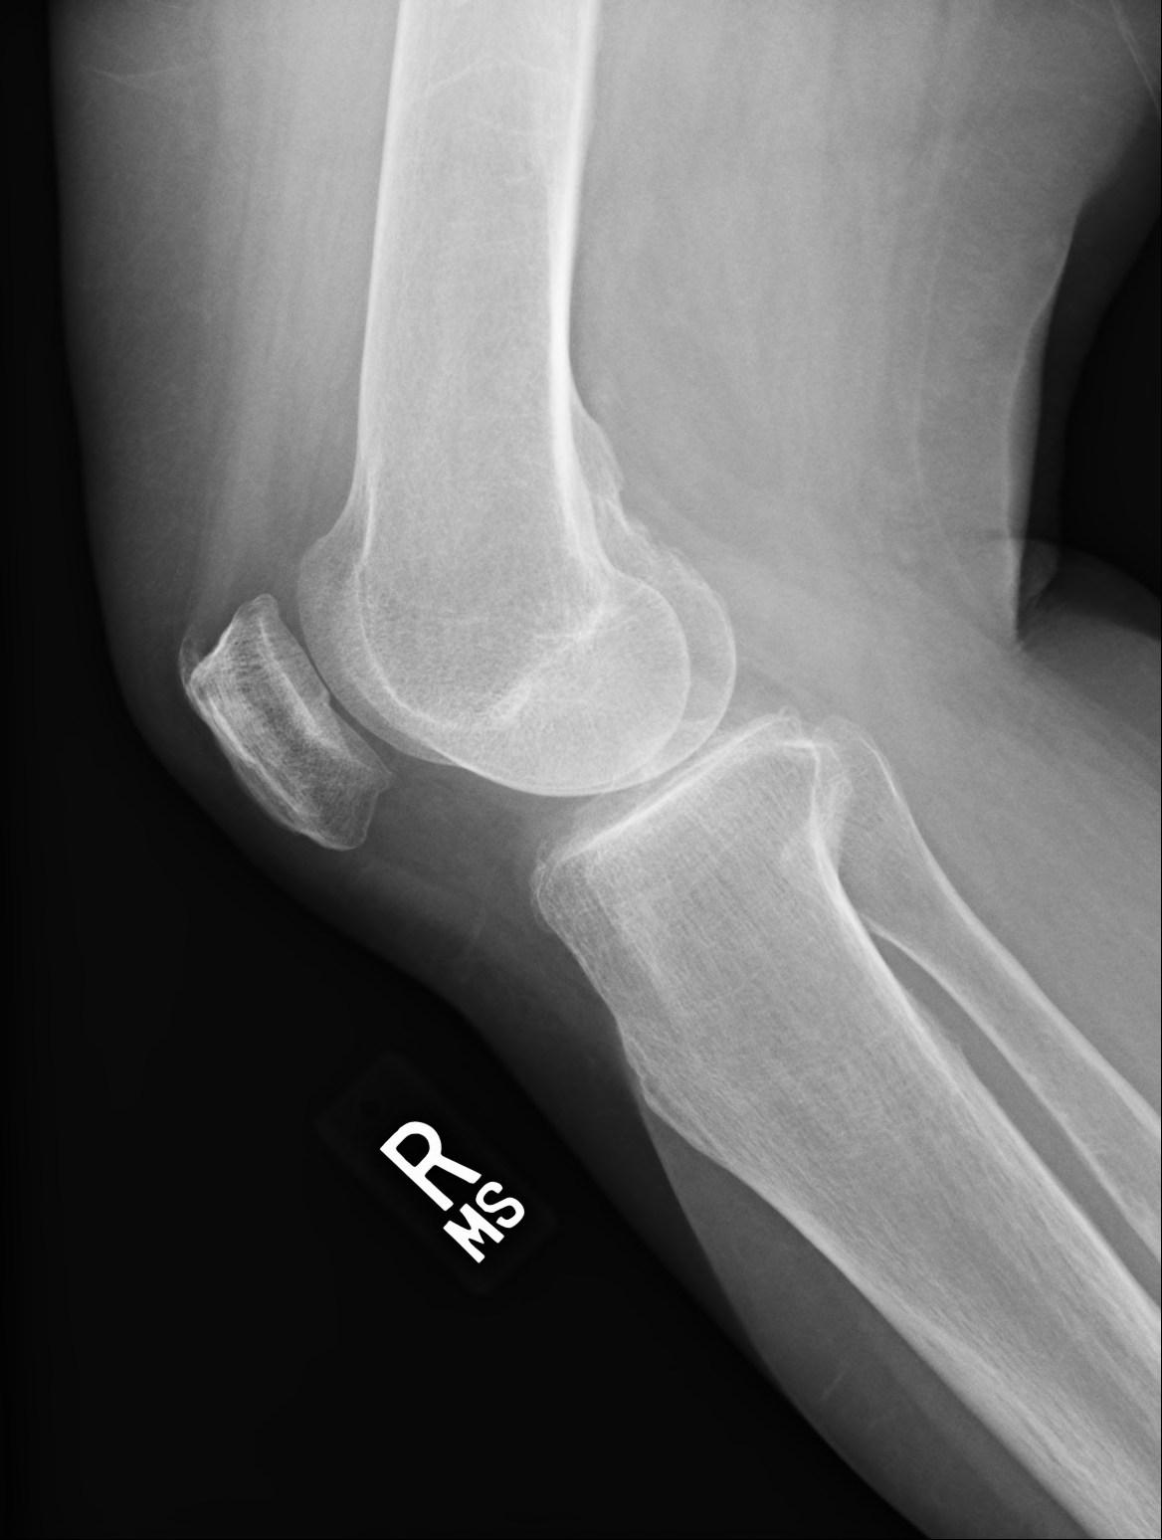
[im 3/4]
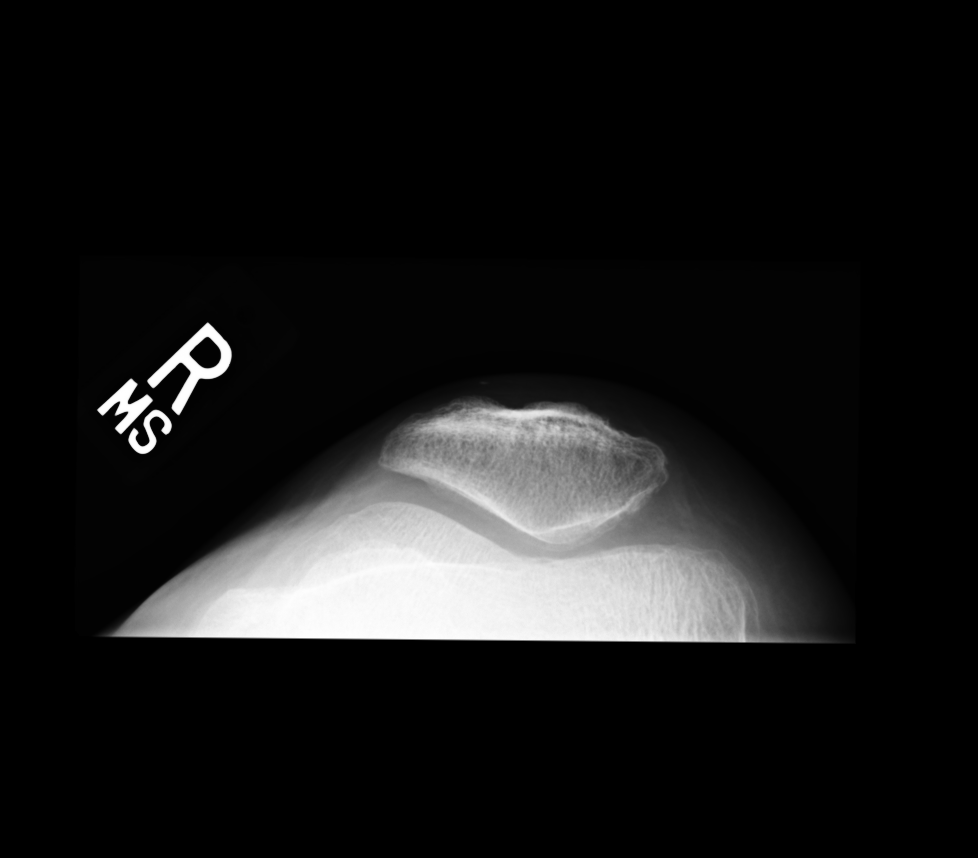
[im 4/4]
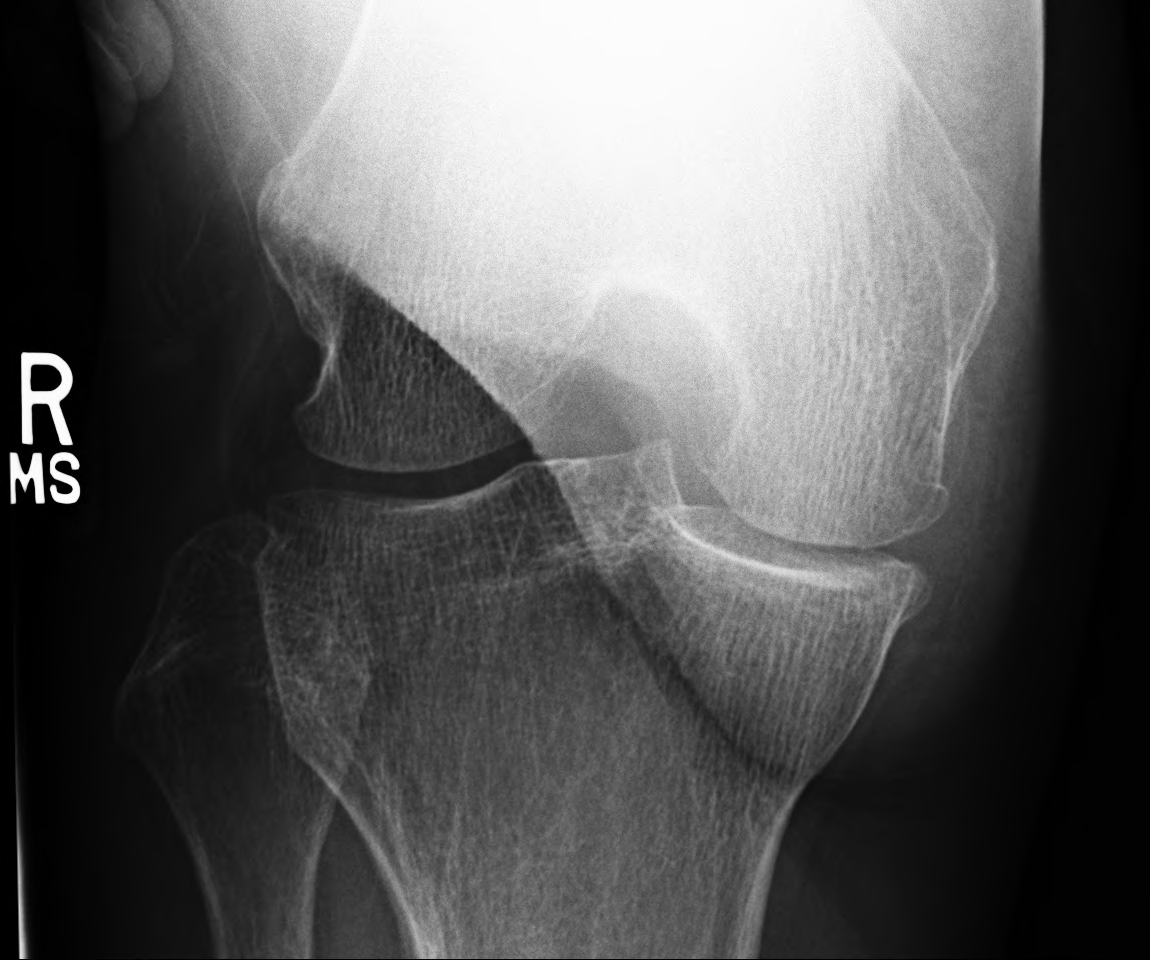

[4 of 4 positions shown; findings below may reference images not displayed]

FINDINGS: No acute fracture or dislocation. No joint effusion. Severe medial compartment DJD. Mild genu varus.
IMPRESSION: Severe right knee DJD, most pronounced at the medial compartment with mild genu varus.

## 2022-01-29 IMAGING — CT TECH CT BRAIN WO CONTRAST
3 of 4 series · 14 of 47 positions shown, 16 images · non-contrast
Comparison: none

HISTORY: Other amnesia
TECHNIQUE: CT Brain was performed without intravenous . Dose reduction technique used: Automated exposure control and adjustment of the mA and/or kV according to patient size.  CT Studies and Cardiac Nuclear Medicine Studies in last 12-months = 1

Tech exam only.

[Series 2: brain · axial · 0.34mm/px · z∈[-628,-502]mm · 8 of 50 slices shown, 10 images]
[im 4/50  brain]
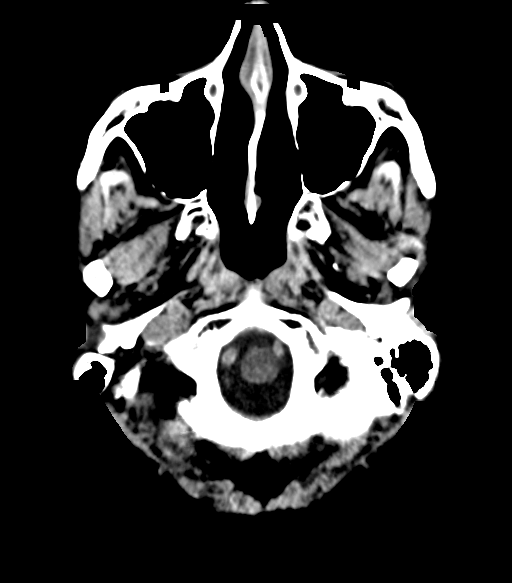
[im 4/50  bone]
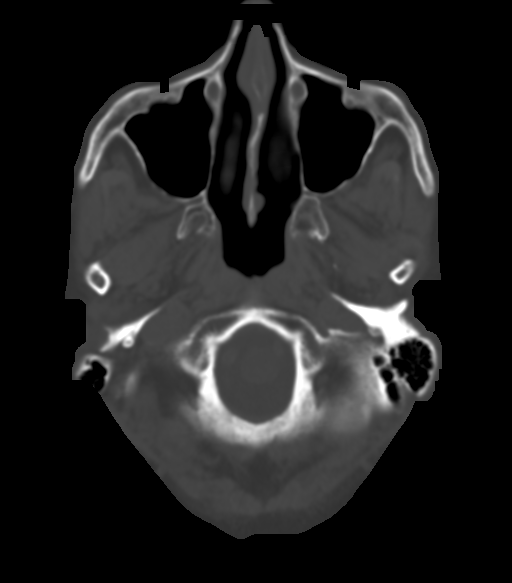
[im 11/50  brain]
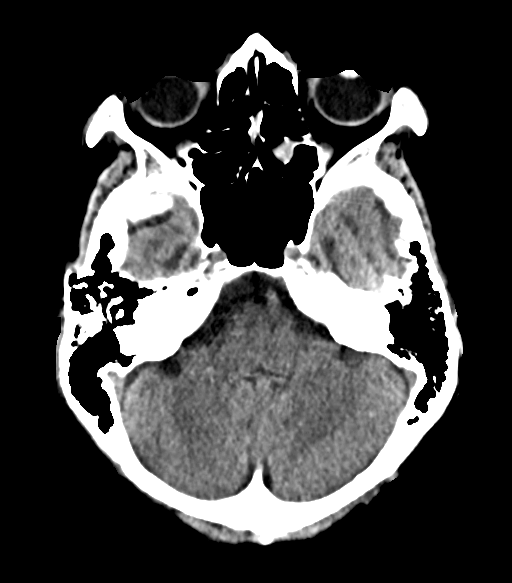
[im 18/50  brain]
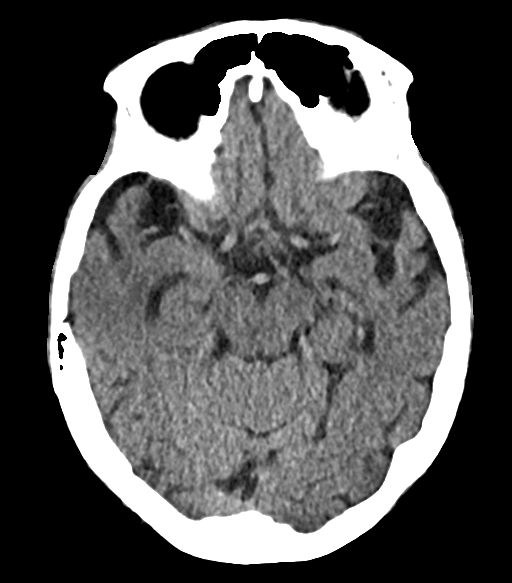
[im 22/50  brain]
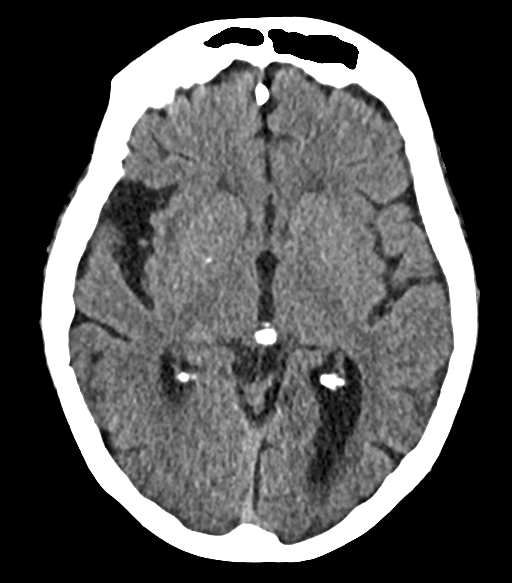
[im 29/50  brain]
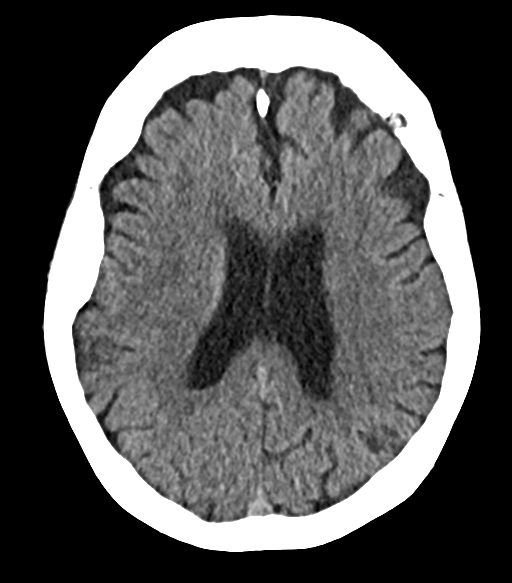
[im 29/50  bone]
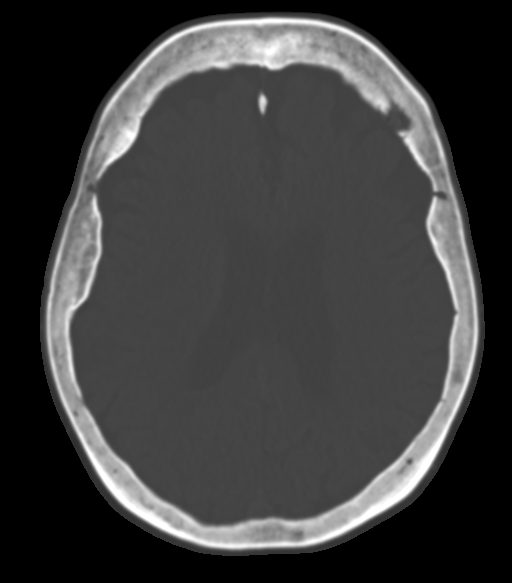
[im 32/50  brain]
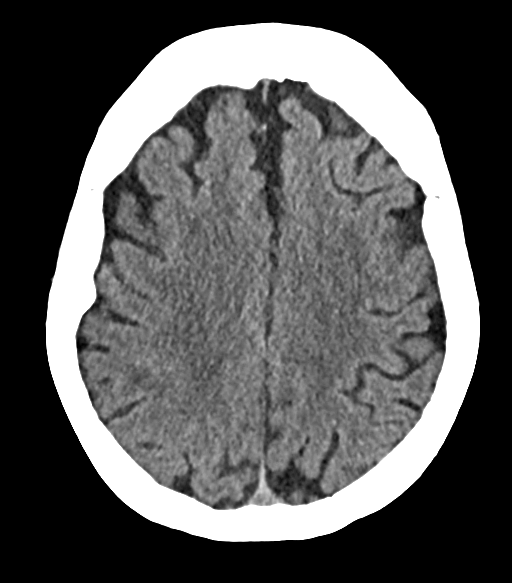
[im 39/50  brain]
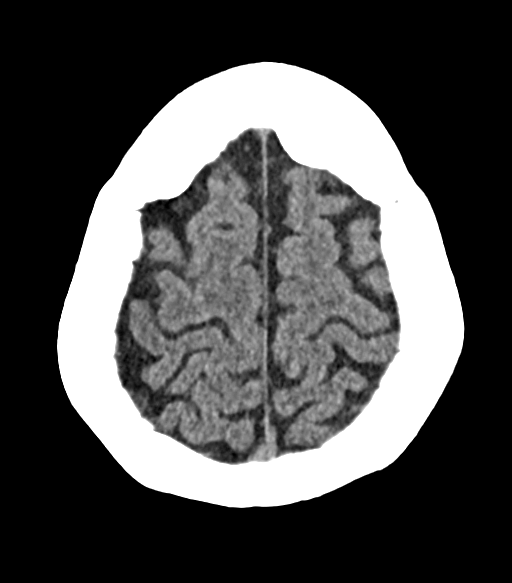
[im 46/50  brain]
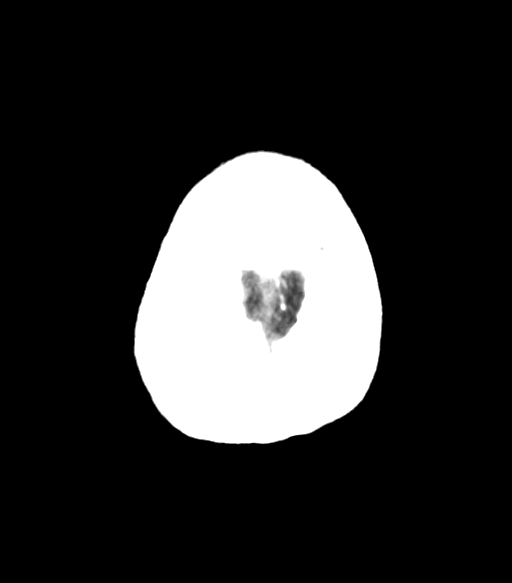

[Series 6: coronal brain · coronal · 0.30mm/px · 3 of 66 slices shown]
[im 22/66  brain]
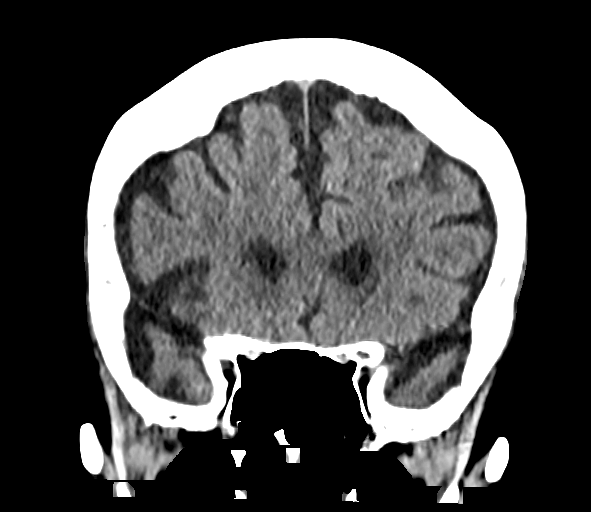
[im 29/66  brain]
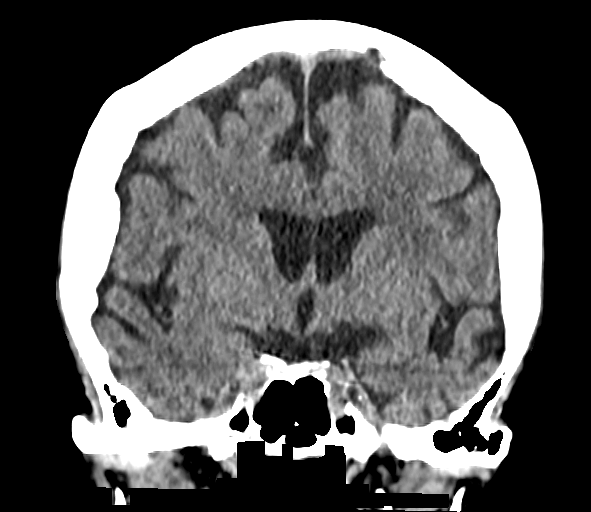
[im 37/66  brain]
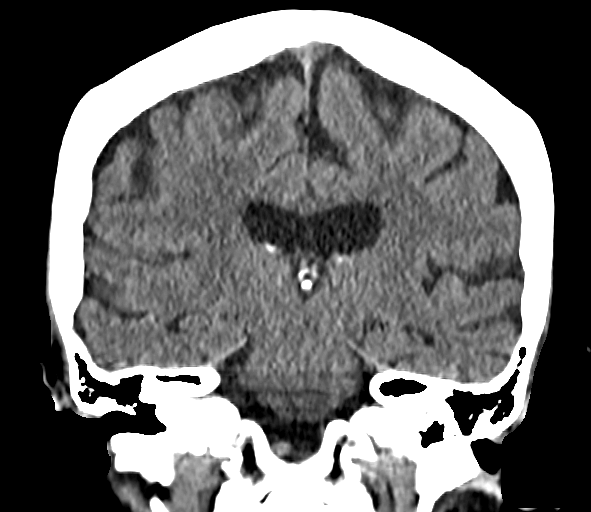

[Series 8: sagittal brain · sagittal · 0.30mm/px · 3 of 58 slices shown]
[im 20/58  brain]
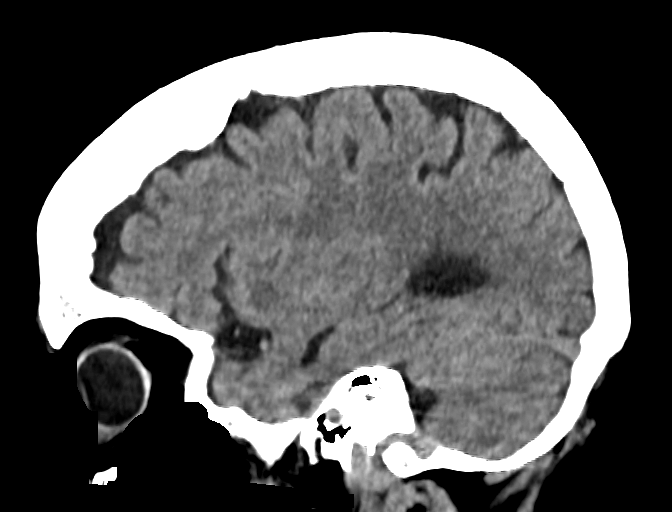
[im 29/58  brain]
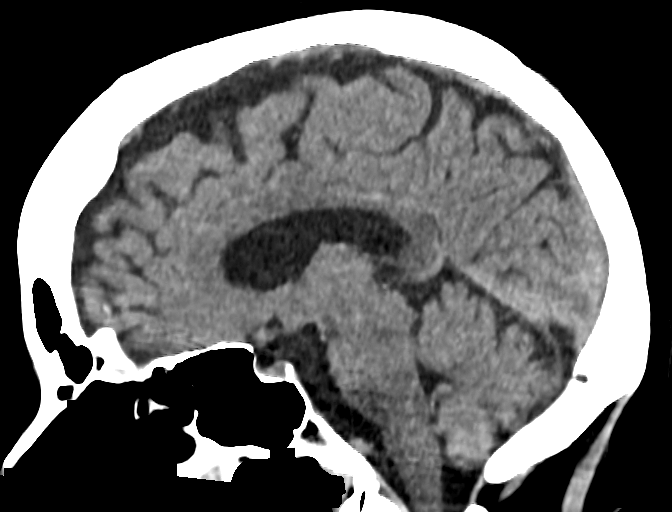
[im 39/58  brain]
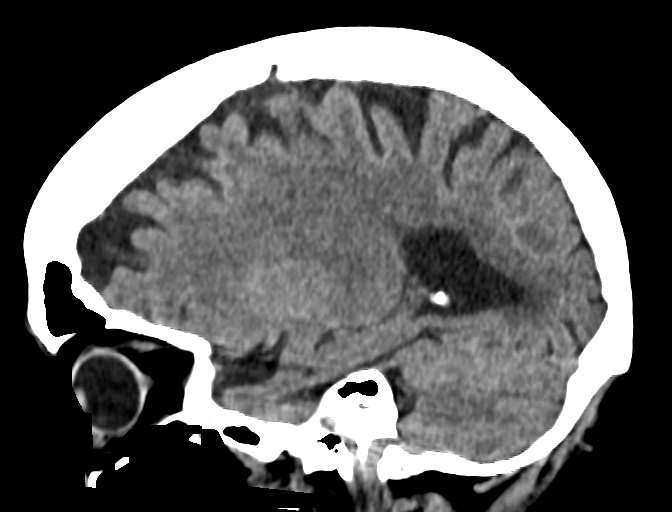

[14 of 47 positions shown; findings below may reference images not displayed]

IMPRESSION: Images not interpreted by [HOSPITAL].

Total radiation dose to patient is CTDIvol 35.50 mGy and DLP 644.00 mGy-cm.

## 2022-09-10 IMAGING — MR MRI BRAIN W/WO CONTRAST
11 series · 48 of 48 positions shown · IV contrast (15CC PROHANCE)
Comparison: Previous MRI of the brain May 01, 2021 and head CT May 16, 2021.

Images Obtained from [facility]
INDICATION: Memory loss.
TECHNIQUE: Multiplanar multisequence MR images of the brain were performed before and after the intravenous administration of 15 mL ProHance.

[Series 5: t1_mpr_axial_pre · axial · 1.0mm · 0.90mm/px · z∈[-9,+146]mm · 6 of 160 slices shown]
[im 1/160]
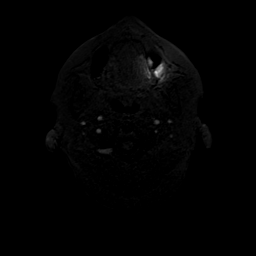
[im 32/160]
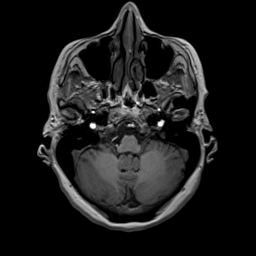
[im 64/160]
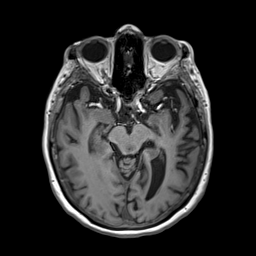
[im 96/160]
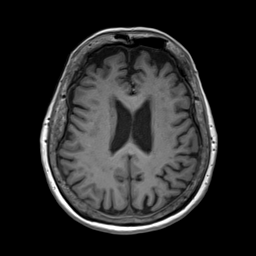
[im 128/160]
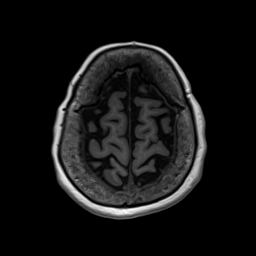
[im 160/160]
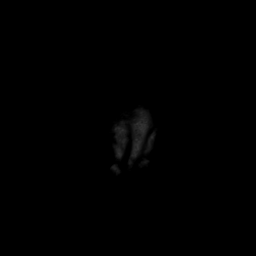

[Series 7: t1_mpr_axial_pre_mpr_cor · coronal · 1.0mm · 0.90mm/px · 8 of 199 slices shown]
[im 1/199]
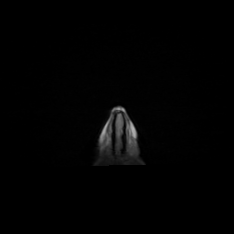
[im 29/199]
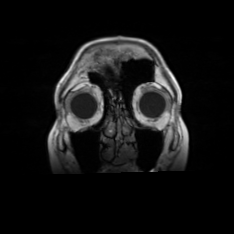
[im 57/199]
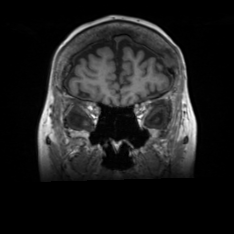
[im 85/199]
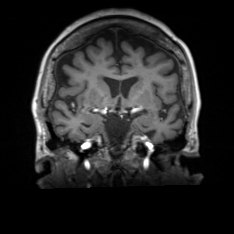
[im 114/199]
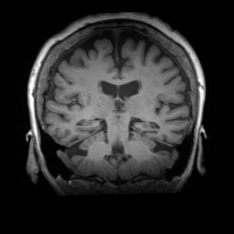
[im 142/199]
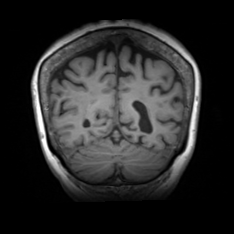
[im 170/199]
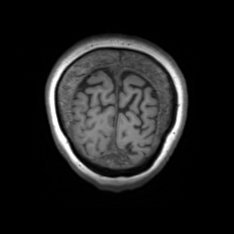
[im 199/199]
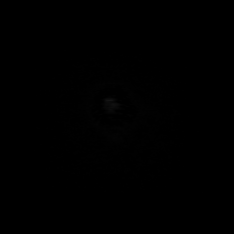

[Series 8: flair_axial_fs · axial · 4.0mm · 0.75mm/px · 1 of 30 slices shown]
[im 1/30]
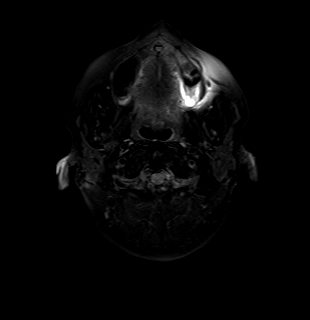

[Series 9: t2_axial · axial · 4.0mm · 0.38mm/px · 1 of 30 slices shown]
[im 1/30]
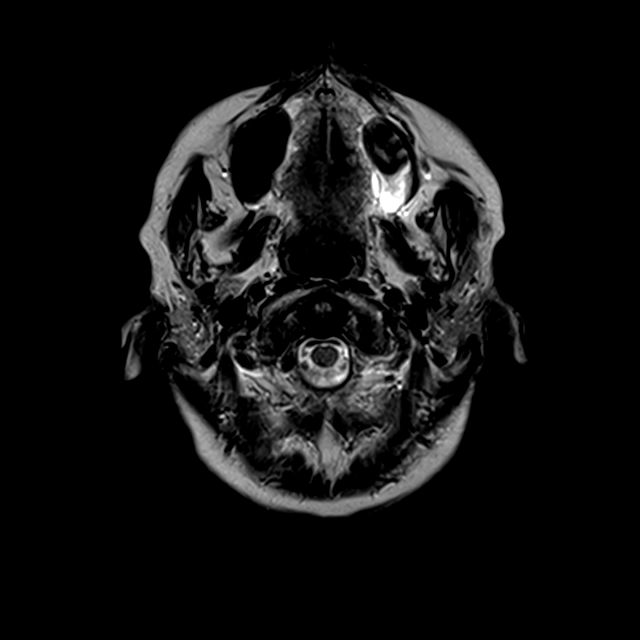

[Series 10: DWI · axial · 4.0mm · 0.86mm/px · 1 of 27 slices shown (1 of 2)]
[im 1/27]
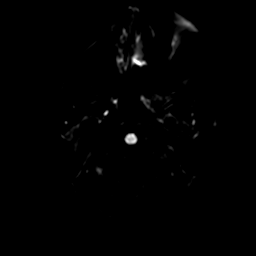

[Series 11: DWI · axial · 4.0mm · 0.86mm/px · 1 of 27 slices shown (2 of 2)]
[im 1/27]
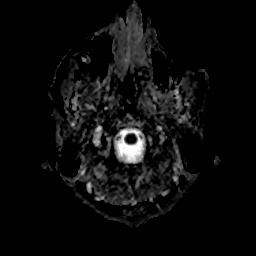

[Series 12: mip_images(sw) · axial · 12.0mm · 0.94mm/px · z∈[-3,+137]mm · 4 of 97 slices shown]
[im 1/97]
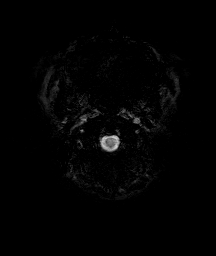
[im 33/97]
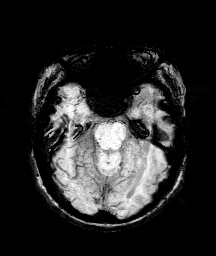
[im 65/97]
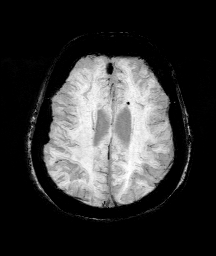
[im 97/97]
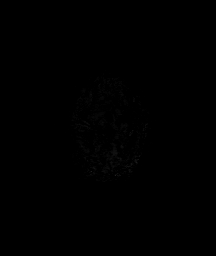

[Series 13: swi_images · axial · 1.5mm · 0.94mm/px · z∈[-8,+142]mm · 4 of 104 slices shown]
[im 1/104]
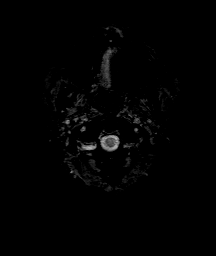
[im 35/104]
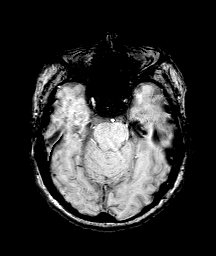
[im 69/104]
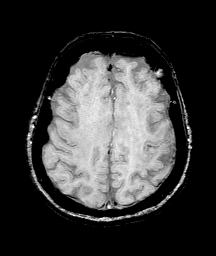
[im 104/104]
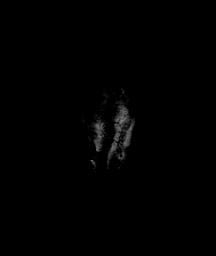

[Series 14: t1_mpr_axial+c · axial · 1.0mm · 0.90mm/px · z∈[-9,+146]mm · 6 of 160 slices shown]
[im 1/160]
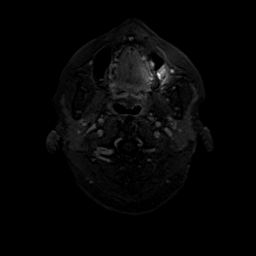
[im 32/160]
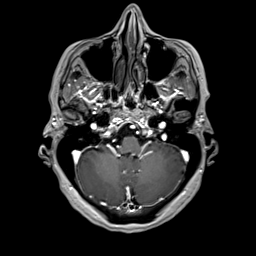
[im 64/160]
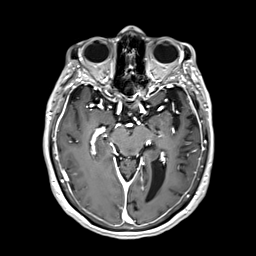
[im 96/160]
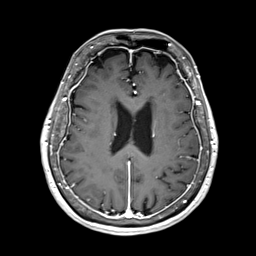
[im 128/160]
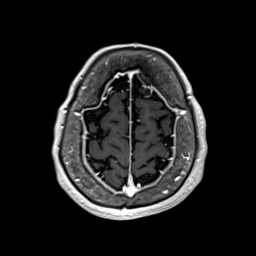
[im 160/160]
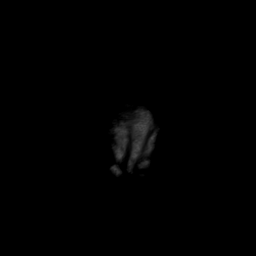

[Series 15: t1_mpr_axial+c_mpr_sag · sagittal · 1.0mm · 0.90mm/px · 8 of 200 slices shown]
[im 1/200]
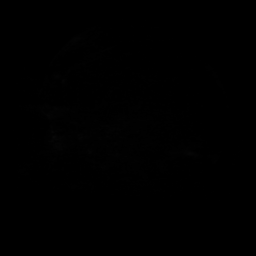
[im 29/200]
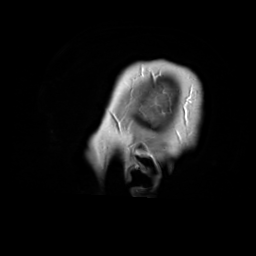
[im 57/200]
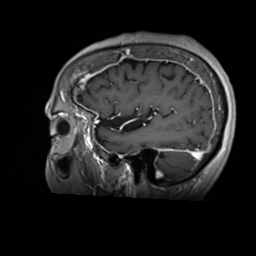
[im 86/200]
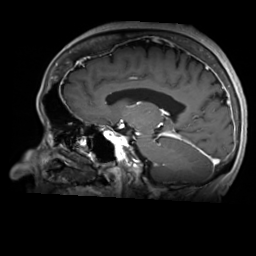
[im 114/200]
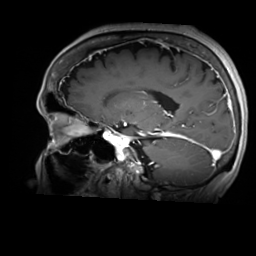
[im 143/200]
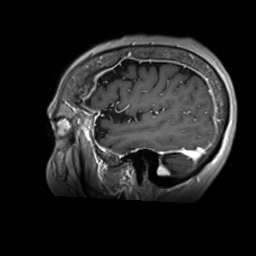
[im 171/200]
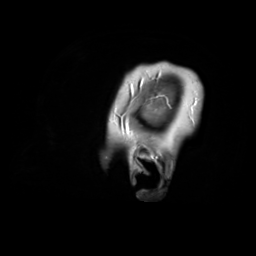
[im 200/200]
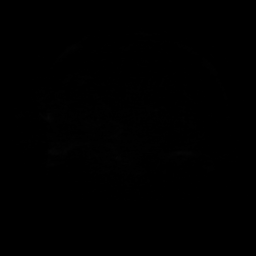

[Series 16: t1_mpr_axial+c_mpr_cor · coronal · 1.0mm · 0.90mm/px · 8 of 199 slices shown]
[im 1/199]
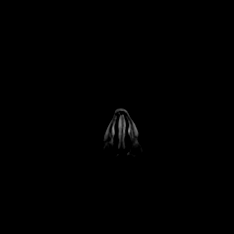
[im 29/199]
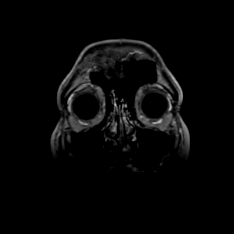
[im 57/199]
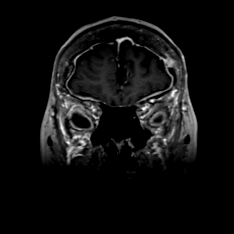
[im 85/199]
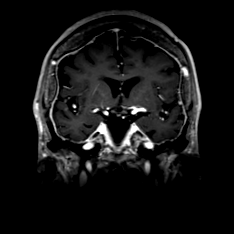
[im 114/199]
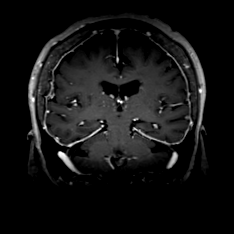
[im 142/199]
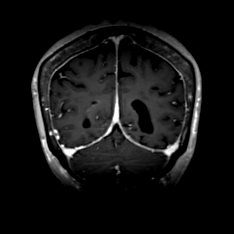
[im 170/199]
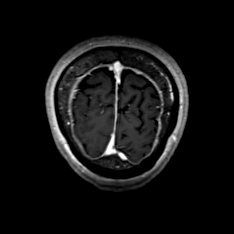
[im 199/199]
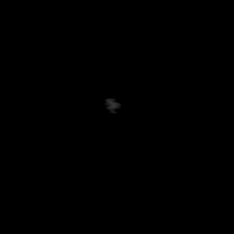

[48 of 48 positions shown; findings below may reference images not displayed]

FINDINGS: There is brain atrophy in keeping with the patient's age. Bilateral subcortical/deep periventricular focal areas of increased signal intensity are seen on FLAIR and T2 weighted sequences.
These show no abnormal signal intensity in the diffusion or gradient sequences to suggest acute ischemia or previous intracranial hemorrhage. There is a single focal area of dark signal intensity in
the medial aspect of the left supraventricular white matter seen in the susceptibility weighted sequence which could represent a small focal area of calcification or hemorrhage. These show no
surrounding white matter edema, mass effect or abnormal enhancement.
There are vascular loops projecting into the internal auditory canals from the bilateral superior cerebellar arteries. The intracranial arteries have normal signal flow voids.
There is no mass effect, shift of the midline structures, ventricular dilatation or abnormal extra-axial fluid collection.
After the intravenous administration of contrast there is diffuse pachymeningitis. Correlation with lumbar puncture is suggested.
There is a focal area of abnormal marrow signal intensity in the anterolateral left frontal bone measuring approximately 14 mm. These show *** density on FLAIR and T2-weighted sequence with decreased
signal intensity seen on T1-weighted sequence and associated enhancement. This is unchanged and nonspecific and could represent a small hemangioma. This is less likely to represent a metastatic
lesion since no significant change is seen. No abnormal intraparenchymal brain enhancement is seen.
No significant change is seen when compared to the previous examination.
Mild left inferolateral mucosal thickening is seen in the left maxillary sinus. Paranasal sinuses are otherwise clear. Mastoids are clear.
IMPRESSION: 1.  Brain atrophy in keeping with the patient's age.
2.  Bilateral subcortical/deep periventricular demyelination most likely due to chronic ischemia.
3.  Diffuse pachymeningitis. Correlation with lumbar puncture is suggested.
4.  Bilateral vascular loops projecting into the internal auditory canals from the lateral superior cerebellar arteries.

## 2023-01-22 IMAGING — CT CT BRAIN WO CONTRAST
3 of 4 series · 14 of 47 positions shown, 16 images · non-contrast
Comparison: Head CT without contrast 12/13/2022

Images Obtained from [facility]
INDICATION: Nontraumatic chronic subdural hemorrhage.
TECHNIQUE: Axial helical 3 mm slices were performed from the base of the skull up to the vertex in soft tissue and bone windows without intravenous contrast. Coronal and sagittal reformatted images
were performed.
Dose reduction technique used: Automated exposure control and adjustment of the mA and/or kV according to patient size.  CT Studies and Cardiac Nuclear Medicine Studies in last 12-months = 1

[Series 2: brain · axial · 0.35mm/px · z∈[-598,-457]mm · 8 of 58 slices shown, 10 images]
[im 5/58  brain]
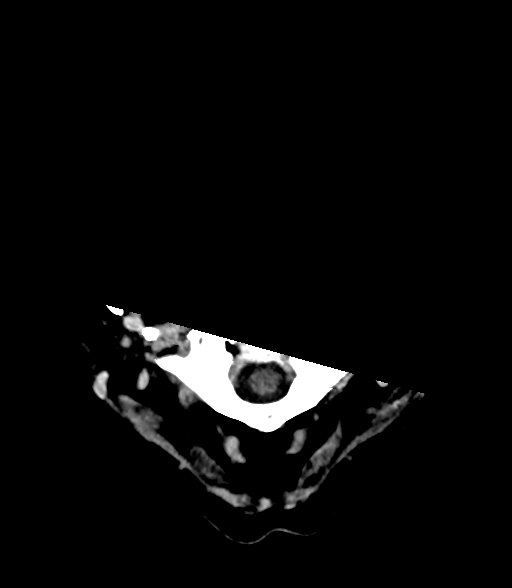
[im 5/58  bone]
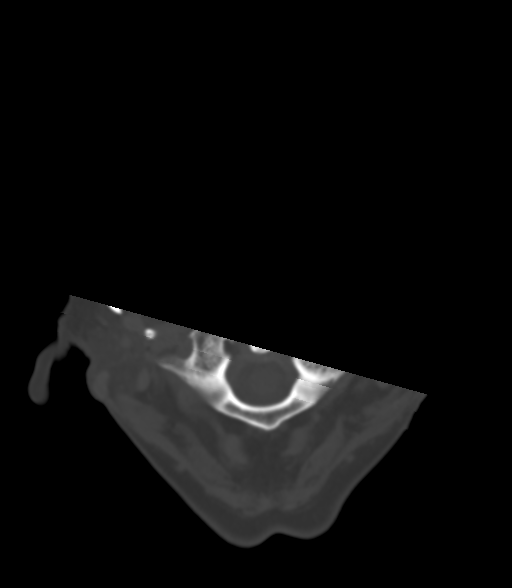
[im 13/58  brain]
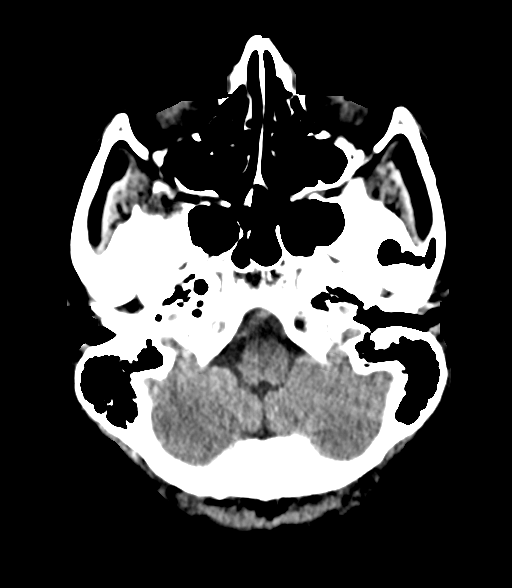
[im 21/58  brain]
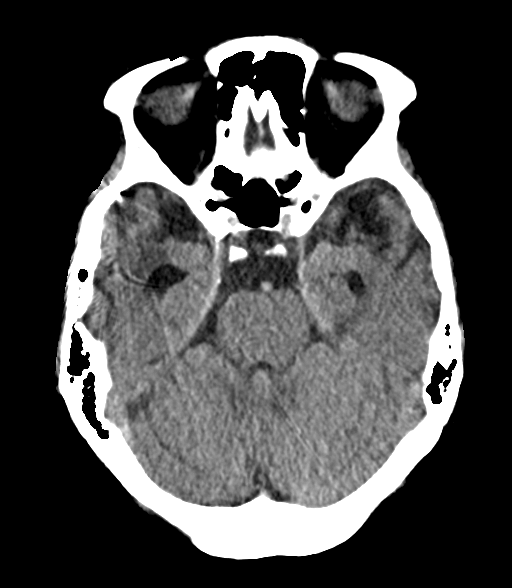
[im 25/58  brain]
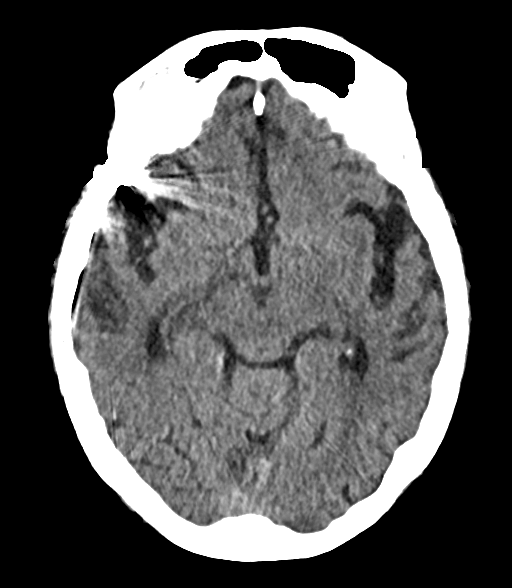
[im 33/58  brain]
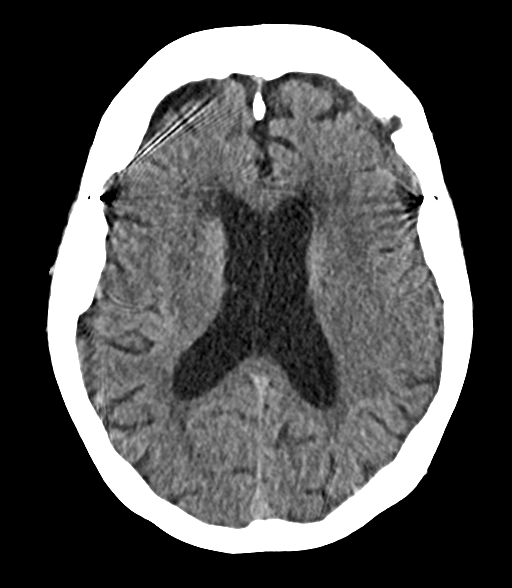
[im 33/58  bone]
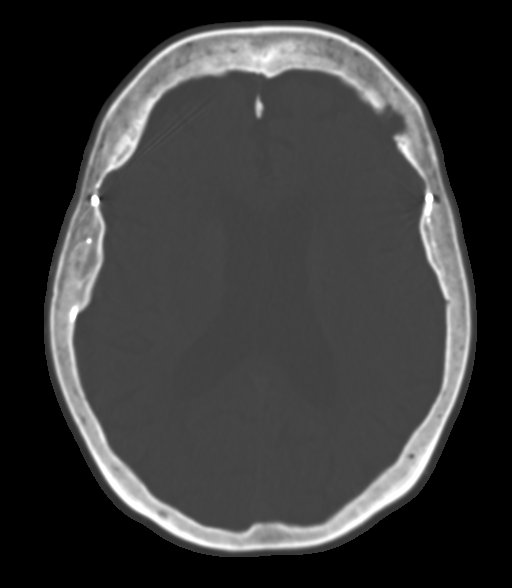
[im 37/58  brain]
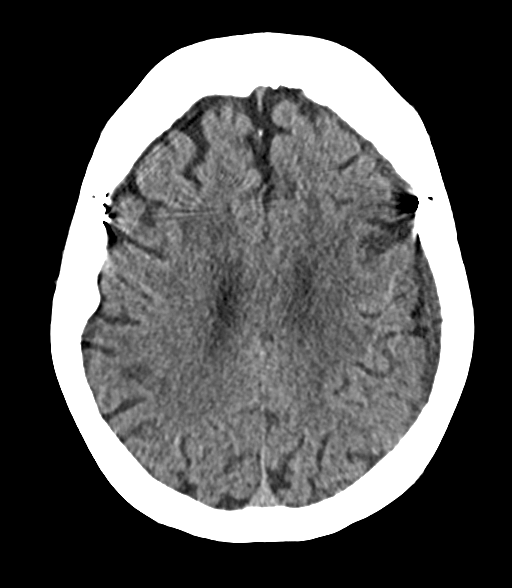
[im 45/58  brain]
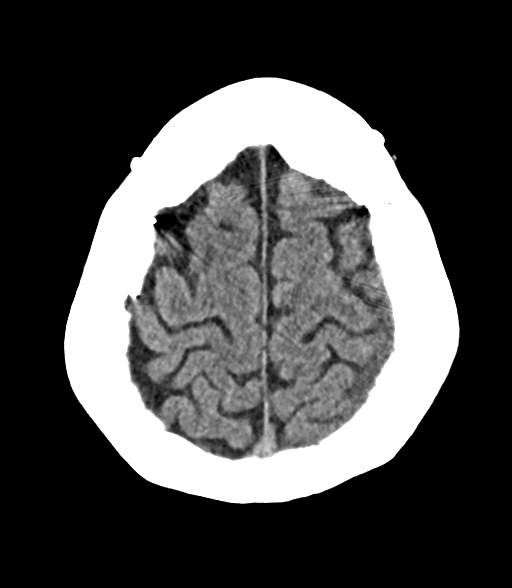
[im 53/58  brain]
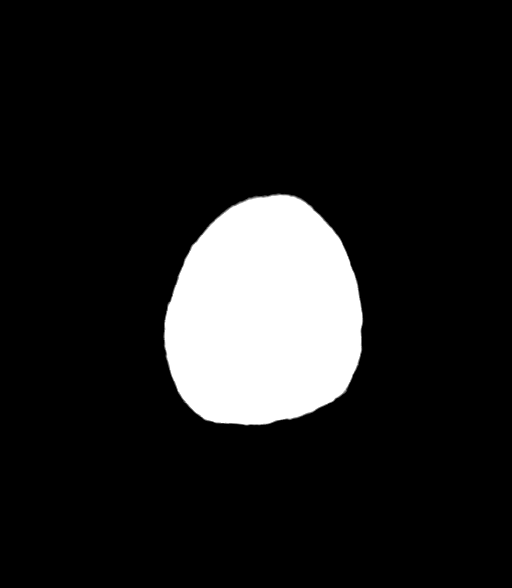

[Series 6: coronal brain · coronal · 0.34mm/px · 3 of 68 slices shown]
[im 23/68  brain]
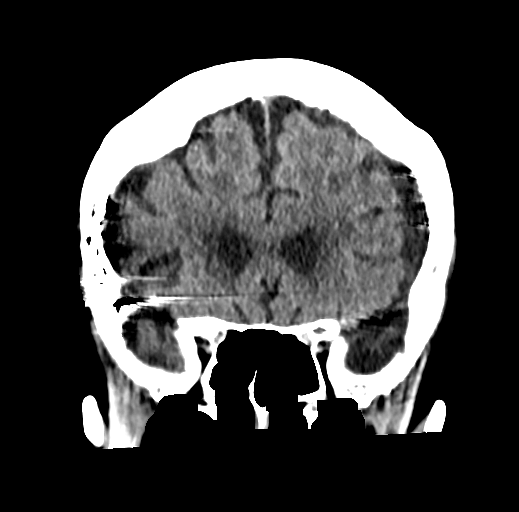
[im 30/68  brain]
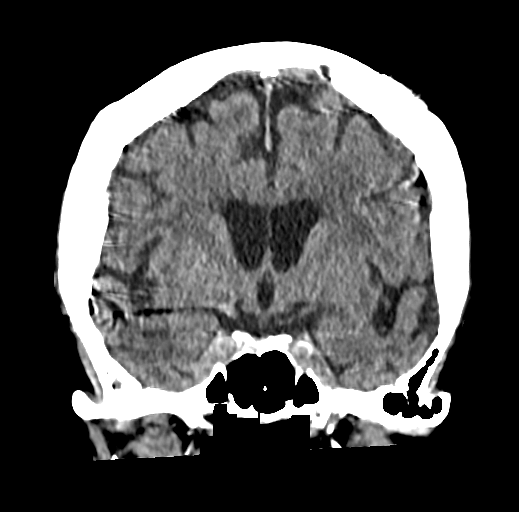
[im 38/68  brain]
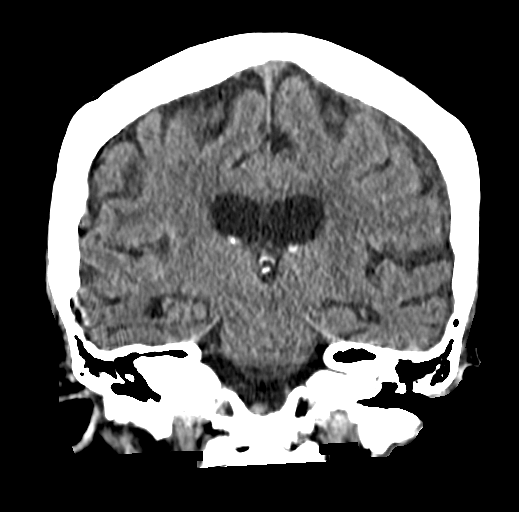

[Series 8: sagittal brain · sagittal · 0.34mm/px · 3 of 59 slices shown]
[im 20/59  brain]
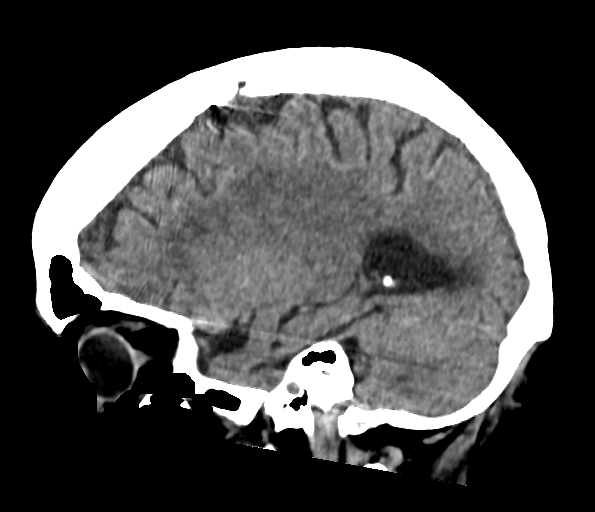
[im 30/59  brain]
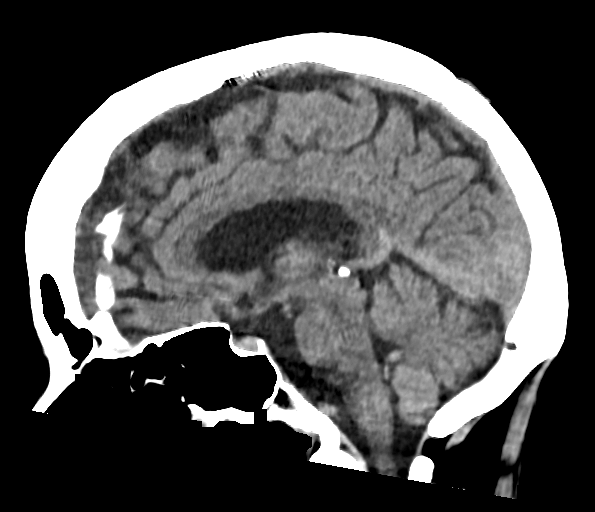
[im 39/59  brain]
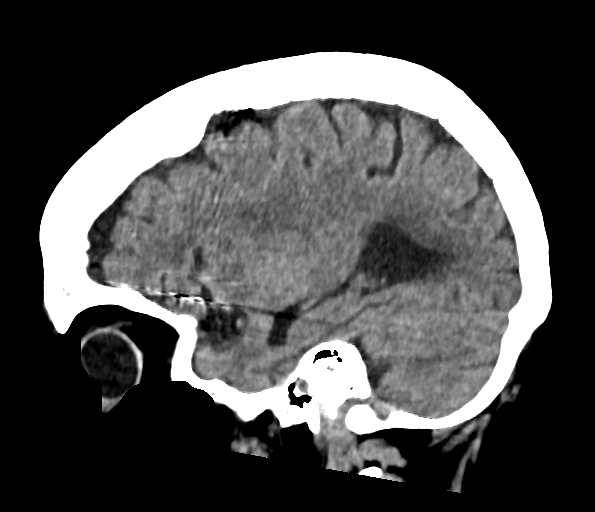

[14 of 47 positions shown; findings below may reference images not displayed]

FINDINGS: There is mild brain atrophy in keeping with the patient's age.
There are bilateral frontal and anterior parietal burr hole is. There has been bilateral embolization of the middle meningeal arteries with hyperdense material and endovascular coils. There is mild
left anterolateral chronic frontal subdural hemorrhage without shift of midline structures. There is bilateral subcortical and/deep periventricular white matter areas of hypodensity most likely due
to chronic microvascular ischemic changes. No evidence of skull fracture is seen. The visualized paranasal sinuses and mastoids are clear. Intraorbital structures are normal with previous right
cataract surgery.
IMPRESSION: 1.  Status post bilateral middle meningeal artery embolization with bilateral frontal and anterior parietal burr holes.
2.  Mild left anterolateral frontal chronic subdural hemorrhage without shift of midline structures.
3.  No evidence of skull fracture.
4.  Mild brain atrophy in keeping with the patient's age.
5.  Bilateral subcortical and/deep periventricular white matter areas of hypodensity most likely due to chronic microvascular ischemic changes.
Total radiation dose to patient is CTDIvol 34.90 mGy and DLP 623.00 mGy-cm.

## 2023-03-12 IMAGING — CT CT BRAIN WO CONTRAST
3 of 4 series · 14 of 47 positions shown, 16 images · non-contrast
Comparison: For 3 4643

Images Obtained from [facility]
REASON FOR EXAM: Traumatic subdural hemorrhage with loss of consciousness status unknown, initial encounter
TECHNIQUE: CT of the head was obtained without contrast. Dose reduction technique used: Automated exposure control and adjustment of the mA and/or kV according to patient size. Total radiation dose
to patient is CTDIvol 36.10 mGy and DLP 654.00 mGy-cm.
. CT/Cardiac Nuclear Medicine exam count in previous 12-months: 6

[Series 2: brain · axial · 0.35mm/px · z∈[-555,-420]mm · 8 of 53 slices shown, 10 images]
[im 4/53  brain]
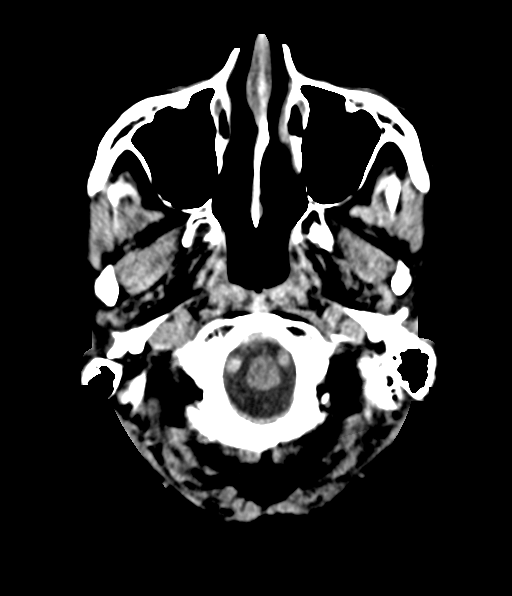
[im 4/53  bone]
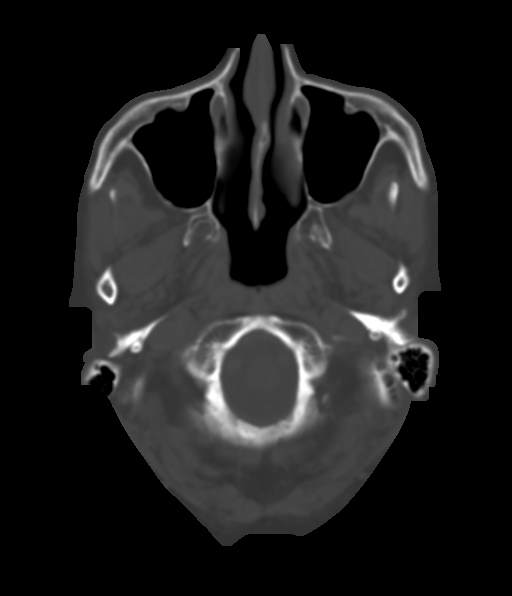
[im 12/53  brain]
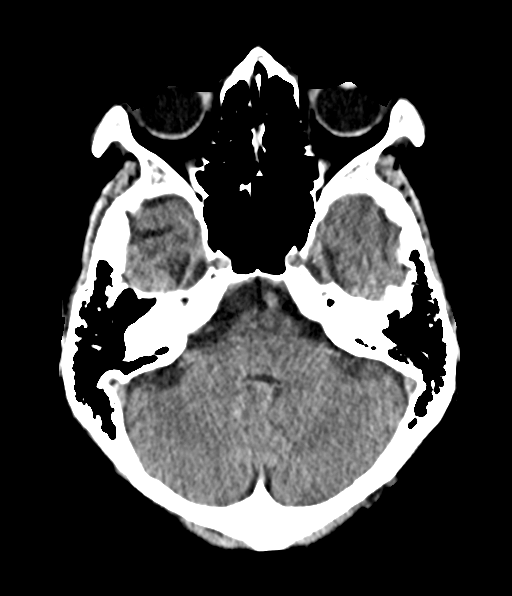
[im 19/53  brain]
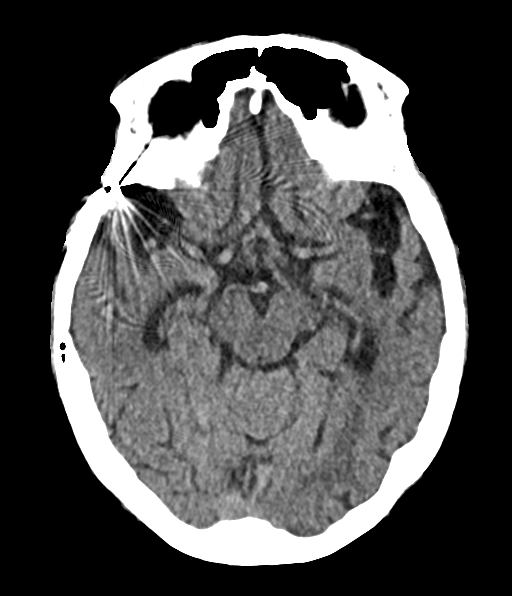
[im 23/53  brain]
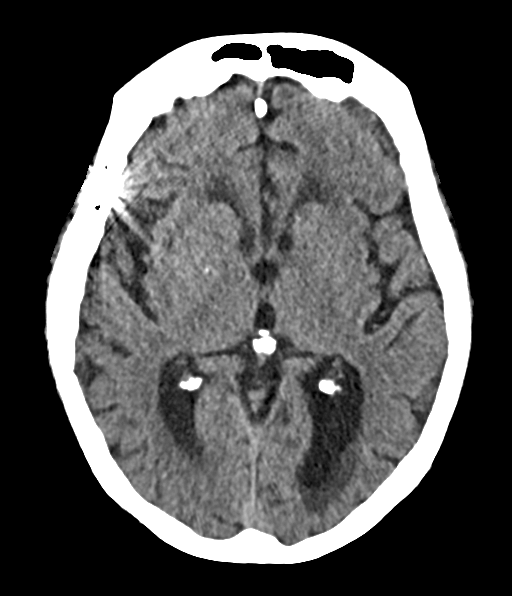
[im 30/53  brain]
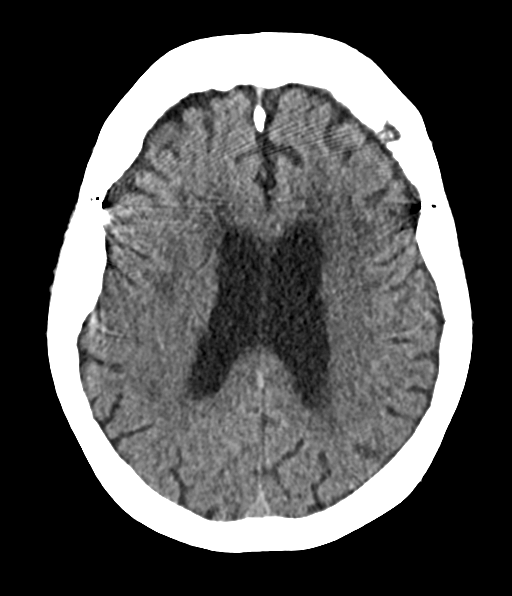
[im 30/53  bone]
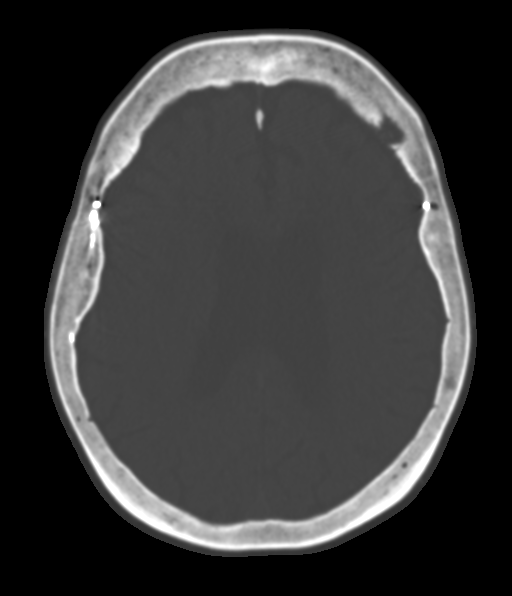
[im 34/53  brain]
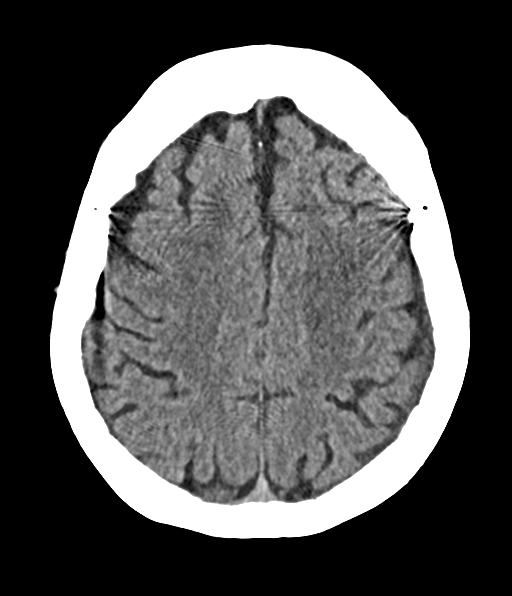
[im 41/53  brain]
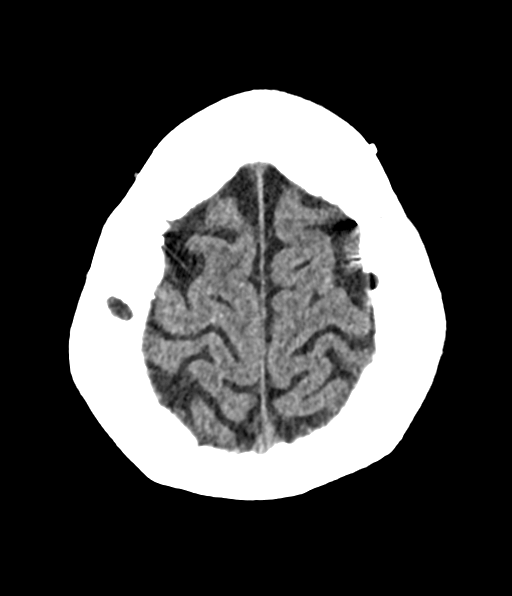
[im 49/53  brain]
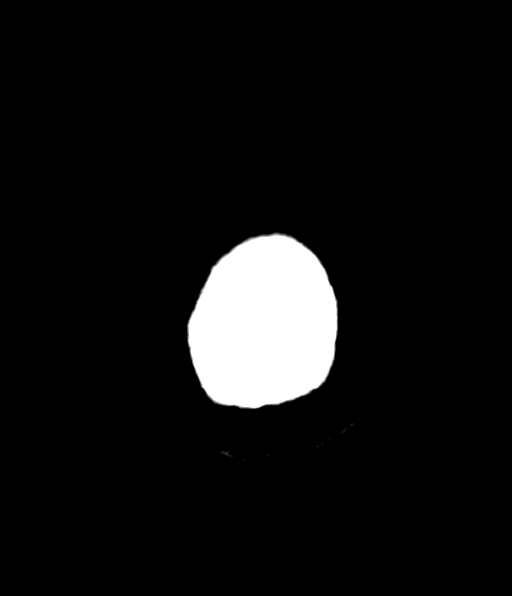

[Series 6: coronal brain · coronal · 0.32mm/px · 3 of 69 slices shown]
[im 23/69  brain]
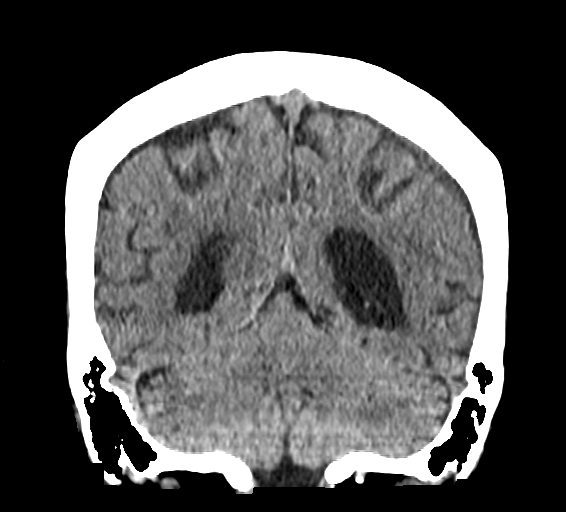
[im 31/69  brain]
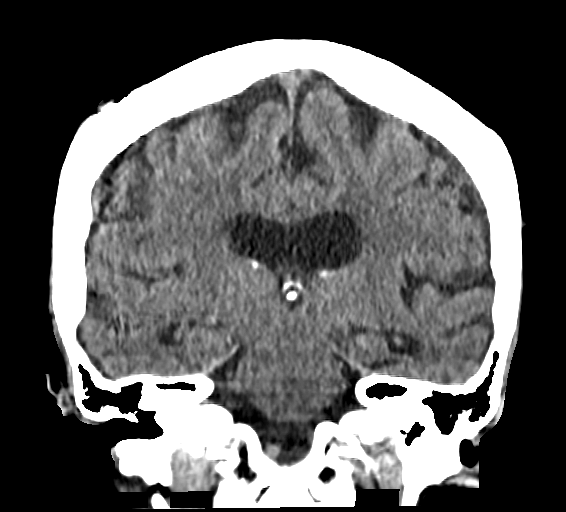
[im 38/69  brain]
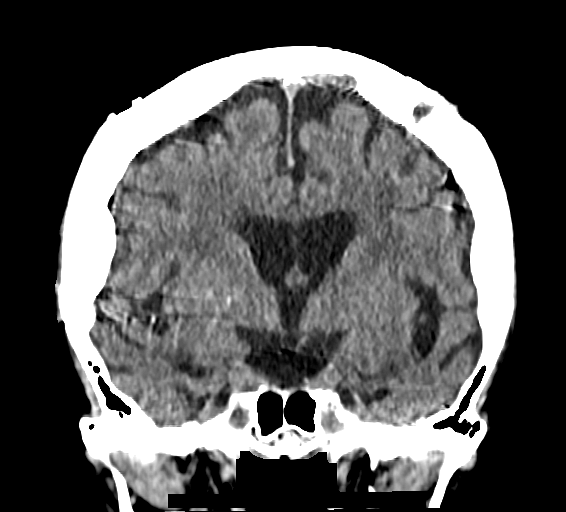

[Series 8: sagittal brain · sagittal · 0.32mm/px · 3 of 58 slices shown]
[im 20/58  brain]
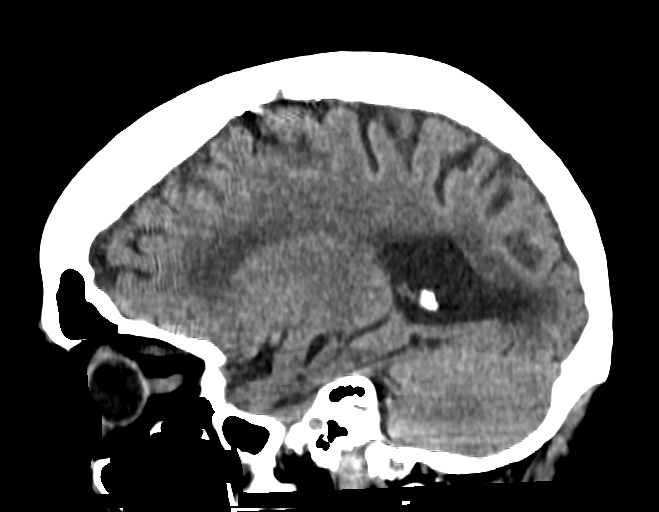
[im 29/58  brain]
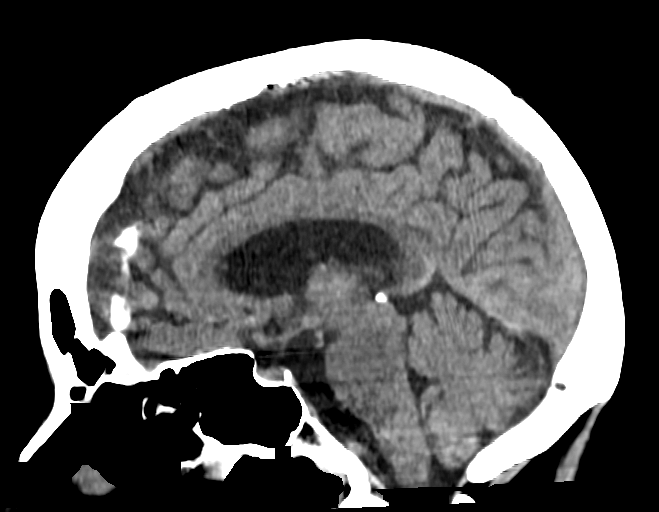
[im 39/58  brain]
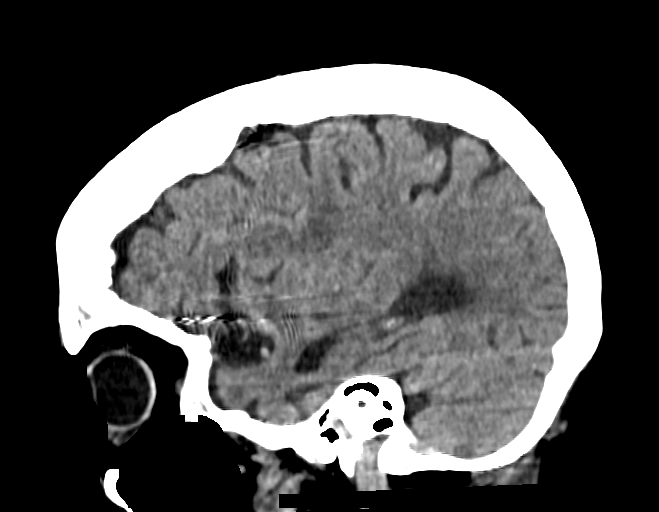

[14 of 47 positions shown; findings below may reference images not displayed]

FINDINGS: BRAIN PARENCHYMA:
Supratentorial white matter areas of hypoattenuation are nonspecific but statistically most likely to be related to chronic microangiopathic changes.
EXTRA-AXIAL SPACES:
There is a smaller hypodense left hemispheric subdural collection measuring up to 5 mm in thickness, previously measuring 6 mm
No right sided subdural hematoma noted at this time allowing for artifact from embolization material.
Evidence of previous bilateral middle meningeal artery embolization
VENTRICULAR SYSTEM:
No hydrocephalus, midline shift or herniation.
BONES:
The calvarium is intact.
Visualized paranasal sinuses and mastoid air cells are clear.
IMPRESSION: 1.  Previous bilateral middle meningeal artery embolization.
2.  Slightly decrease in thickness of chronic left subdural hematoma.
